# Patient Record
Sex: Female | Born: 1945 | Race: White | Hispanic: No | Smoking: Never smoker
Health system: Southern US, Community
[De-identification: ages and names within clinical notes are randomized; demographics above are authoritative.]

## PROBLEM LIST (undated history)

## (undated) DIAGNOSIS — E039 Hypothyroidism, unspecified: Secondary | ICD-10-CM

## (undated) DIAGNOSIS — F909 Attention-deficit hyperactivity disorder, unspecified type: Secondary | ICD-10-CM

## (undated) DIAGNOSIS — E785 Hyperlipidemia, unspecified: Secondary | ICD-10-CM

## (undated) DIAGNOSIS — F419 Anxiety disorder, unspecified: Secondary | ICD-10-CM

## (undated) DIAGNOSIS — N8501 Benign endometrial hyperplasia: Secondary | ICD-10-CM

## (undated) DIAGNOSIS — F329 Major depressive disorder, single episode, unspecified: Secondary | ICD-10-CM

## (undated) DIAGNOSIS — F32A Depression, unspecified: Secondary | ICD-10-CM

## (undated) DIAGNOSIS — M1990 Unspecified osteoarthritis, unspecified site: Secondary | ICD-10-CM

## (undated) DIAGNOSIS — I10 Essential (primary) hypertension: Secondary | ICD-10-CM

## (undated) DIAGNOSIS — N289 Disorder of kidney and ureter, unspecified: Secondary | ICD-10-CM

## (undated) DIAGNOSIS — K5190 Ulcerative colitis, unspecified, without complications: Secondary | ICD-10-CM

## (undated) DIAGNOSIS — Z9289 Personal history of other medical treatment: Secondary | ICD-10-CM

## (undated) DIAGNOSIS — I639 Cerebral infarction, unspecified: Secondary | ICD-10-CM

## (undated) DIAGNOSIS — E079 Disorder of thyroid, unspecified: Secondary | ICD-10-CM

## (undated) HISTORY — PX: COLON SURGERY: SHX602

## (undated) HISTORY — PX: WISDOM TOOTH EXTRACTION: SHX21

## (undated) HISTORY — PX: EYE SURGERY: SHX253

## (undated) HISTORY — PX: COLONOSCOPY: SHX174

## (undated) HISTORY — PX: OTHER SURGICAL HISTORY: SHX169

---

## 1997-05-31 ENCOUNTER — Ambulatory Visit (HOSPITAL_COMMUNITY): Admission: RE | Admit: 1997-05-31 | Discharge: 1997-05-31 | Payer: Self-pay | Admitting: General Surgery

## 2000-01-02 ENCOUNTER — Other Ambulatory Visit: Admission: RE | Admit: 2000-01-02 | Discharge: 2000-01-02 | Payer: Self-pay | Admitting: Obstetrics and Gynecology

## 2000-02-13 ENCOUNTER — Encounter: Payer: Self-pay | Admitting: Critical Care Medicine

## 2000-02-13 ENCOUNTER — Ambulatory Visit (HOSPITAL_COMMUNITY): Admission: RE | Admit: 2000-02-13 | Discharge: 2000-02-13 | Payer: Self-pay | Admitting: Critical Care Medicine

## 2000-05-07 ENCOUNTER — Encounter: Payer: Self-pay | Admitting: Critical Care Medicine

## 2000-05-07 ENCOUNTER — Ambulatory Visit (HOSPITAL_COMMUNITY): Admission: RE | Admit: 2000-05-07 | Discharge: 2000-05-07 | Payer: Self-pay | Admitting: Critical Care Medicine

## 2001-01-27 ENCOUNTER — Other Ambulatory Visit: Admission: RE | Admit: 2001-01-27 | Discharge: 2001-01-27 | Payer: Self-pay | Admitting: Obstetrics and Gynecology

## 2002-01-07 ENCOUNTER — Other Ambulatory Visit: Admission: RE | Admit: 2002-01-07 | Discharge: 2002-01-07 | Payer: Self-pay | Admitting: Gynecology

## 2003-02-06 DIAGNOSIS — Z9289 Personal history of other medical treatment: Secondary | ICD-10-CM

## 2003-02-06 HISTORY — DX: Personal history of other medical treatment: Z92.89

## 2003-06-01 ENCOUNTER — Ambulatory Visit (HOSPITAL_BASED_OUTPATIENT_CLINIC_OR_DEPARTMENT_OTHER): Admission: RE | Admit: 2003-06-01 | Discharge: 2003-06-01 | Payer: Self-pay | Admitting: Orthopedic Surgery

## 2003-06-01 ENCOUNTER — Encounter (INDEPENDENT_AMBULATORY_CARE_PROVIDER_SITE_OTHER): Payer: Self-pay | Admitting: *Deleted

## 2003-06-01 ENCOUNTER — Ambulatory Visit (HOSPITAL_COMMUNITY): Admission: RE | Admit: 2003-06-01 | Discharge: 2003-06-01 | Payer: Self-pay | Admitting: Orthopedic Surgery

## 2003-09-14 ENCOUNTER — Inpatient Hospital Stay (HOSPITAL_COMMUNITY): Admission: EM | Admit: 2003-09-14 | Discharge: 2003-09-23 | Payer: Self-pay | Admitting: Emergency Medicine

## 2003-09-15 ENCOUNTER — Encounter (INDEPENDENT_AMBULATORY_CARE_PROVIDER_SITE_OTHER): Payer: Self-pay | Admitting: Specialist

## 2003-12-02 ENCOUNTER — Other Ambulatory Visit: Admission: RE | Admit: 2003-12-02 | Discharge: 2003-12-02 | Payer: Self-pay | Admitting: Gynecology

## 2004-10-13 ENCOUNTER — Encounter: Admission: RE | Admit: 2004-10-13 | Discharge: 2004-10-13 | Payer: Self-pay | Admitting: Nephrology

## 2004-12-07 ENCOUNTER — Other Ambulatory Visit: Admission: RE | Admit: 2004-12-07 | Discharge: 2004-12-07 | Payer: Self-pay | Admitting: Gynecology

## 2005-02-18 ENCOUNTER — Encounter: Admission: RE | Admit: 2005-02-18 | Discharge: 2005-02-18 | Payer: Self-pay | Admitting: Orthopaedic Surgery

## 2005-05-11 ENCOUNTER — Emergency Department (HOSPITAL_COMMUNITY): Admission: EM | Admit: 2005-05-11 | Discharge: 2005-05-12 | Payer: Self-pay | Admitting: Emergency Medicine

## 2006-01-02 ENCOUNTER — Other Ambulatory Visit: Admission: RE | Admit: 2006-01-02 | Discharge: 2006-01-02 | Payer: Self-pay | Admitting: Gynecology

## 2007-01-23 ENCOUNTER — Other Ambulatory Visit: Admission: RE | Admit: 2007-01-23 | Discharge: 2007-01-23 | Payer: Self-pay | Admitting: Gynecology

## 2007-06-05 ENCOUNTER — Emergency Department (HOSPITAL_COMMUNITY): Admission: EM | Admit: 2007-06-05 | Discharge: 2007-06-05 | Payer: Self-pay | Admitting: Emergency Medicine

## 2010-03-03 ENCOUNTER — Other Ambulatory Visit: Payer: Self-pay | Admitting: Gynecology

## 2010-06-23 NOTE — Consult Note (Signed)
NAME:  Cassidy Jones, Cassidy Jones                      ACCOUNT NO.:  [id number]   MEDICAL RECORD NO.:  [id number]                   PATIENT TYPE:  INP   LOCATION:  1824                                 FACILITY:  [facility]   PHYSICIAN:  James L. Deterding, M.D.            DATE OF BIRTH:  May 20, 1945   DATE OF CONSULTATION:  DATE OF DISCHARGE:                                   CONSULTATION   CONSULTING PHYSICIAN:  James L. Deterding, M.D.   REFERRING PHYSICIAN:  Mark A. Perini, M.D.   REASON FOR CONSULTATION:  Acute renal failure.   HISTORY OF PRESENT ILLNESS:  This is a 65 year old woman with an extensive  past medical history who was brought to the ER after having been found to  have a creatinine of 5.9 with a calcium of 15.2.  Her past medical history  is significant for rheumatic fever as a child, history of a thyroidectomy  for goiters and at age 26, and ulcerative colitis diagnosed at age 37.  She  was on steroids for about 20 years, had an ileoanal pull through with  ileostomy and subsequent rectal pouch in 1987.  She has a history of chronic  pouchitis, treated with antibiotics, history of fibrocystic breast, history  of ADD, osteoporosis, and a history of an RV fistula.  She was started on 6-  MP in May for UC complications, also about a month or so prior to that she  increased her calcium with D to 4-6 a day.  She has been taking that a  number of months and she says at least that many.  She complains at this  time of aches in her hands, shoulders, hips, feet, increased thirst.  She  has no dysuria, hematuria, no stones, no family history of renal disease, no  history of hypertension.  She has had a progressive anemia also during this  time.   REVIEW OF SYSTEMS:  HEENT:  She denies visual trouble, headaches, sores in  her eyes, dry mouth.  No hearing difficulties.  PULMONARY:  No asthma, hay-  fever, cough, sputum production. GI:  As above.  No history of hepatitis,  yellow jaundice.   SKIN:  Itching has been pretty intense recently.  MUSCULOSKELETAL:  As listed above.  NEUROLOGIC:  Negative.   PAST MEDICAL HISTORY:  1. As above.  2. Also she has had the thyroidectomy.  3. She had also the ileostomy takedown.   ALLERGIES:  ___________ allergies.   SOCIAL HISTORY:  She lives with her husband.  Nondrinker, nonsmoker.  She  used to be an Print production planner for [employer].   OBJECTIVE/PHYSICAL EXAMINATION:  VITAL SIGNS:  Blood pressure supine is  128/68, heart rate 82, blood pressure 146/72, heart rate 86 standing.  GENERAL:  She is in no acute distress.  HEENT:  Shows benign fundi and pharynx.  NECK:  Shows the thyroidectomy scar.  No thyromegaly.  LUNGS:  Reveal no rales, rhonchi,  wheeze.  Normal to percussion, normal  expansion, normal breath sounds.  ABDOMEN:  Positive bowel sounds.  Mild lower abdominal distention with mild  diffuse tenderness.  Liver is down 2- to -3-cm.  SKIN:  Shows her to be somewhat pale.  No significant rashes.  MUSCULOSKELETAL:  She has hypertrophic changes in her hands and feet,  otherwise unremarkable.  NEUROLOGIC:  Cranial nerves II-XII grossly intact.  Motor is 5/5 symmetric.  Deep tendon reflexes 2+/4+.  Toes are downgoing.   LABORATORY DATA:  In November 2004, creatinine was 0.9, BUN was 16, and no  electrolyte abnormalities.  Creatinine, on Jul 04, 2003, was 1.8 with a BUN  of 21, and calcium was 11.6 at that time.  Hemoglobin, on August 12, 2003, was  10.5.  Laboratory data from, September 14, 2003, reveal sodium 136, potassium  4.1, chloride 95, bicarbonate 26, creatinine 5.9, BUN 68, glucose 113.  Iron  sat at 16%.   ASSESSMENT:  1. Acute renal failure.  Differential diagnoses include hypercalcemic toxic     acute renal failure, acute interstitial nephritis or acute tubular     necrosis of medications, i.e., the 6-MP, or obstruction.  Suspect she has     hypercalcemic etiology second and this contributes to the fact that  she     has low urine volume because of high volume loss in her stool and     concentrates her calcium because of that.  Cannot rule out acute     interstitial nephritis from drugs, i.e., the 6-MP but we will try to sort     that out by looking at her urine sediment.  Treatment at this point is     saline and Lasix to try to get her to excrete the calcium and watch her     renal function closely.  We also need to evaluate for obstruction with an     ultrasound make sure she does not have nephrocalcinosis.  2. Ulcerative colitis.  3. Hypothyroidism.  4. Attention deficit disorder.  5. Hypercalcemic suspect this is exogenous to intake but rule out primary     hyperparathyroidism or dysproteinemia.  6. Anemia, renal and low serum iron.   PLAN:  1. IV fluids with normal saline and follow with Lasix.  2. Urinalysis.  3. Urine sodium creatinine.  4. IV iron.  5. PTH.  6. Serum urine electrophoresis .  7. Ultrasound.                                               James L. Deterding, M.D.    JLD/MEDQ  D:  09/14/2003  T:  09/14/2003  Job:  [id number]

## 2010-06-23 NOTE — Discharge Summary (Signed)
NAME:  Cassidy Jones, Cassidy Jones                      ACCOUNT NO.:  [id number]   MEDICAL RECORD NO.:  [id number]                   PATIENT TYPE:  INP   LOCATION:  2033                                 FACILITY:  [facility]   PHYSICIAN:  Mark A. Perini, M.D.                DATE OF BIRTH:  30-May-1945   DATE OF ADMISSION:  09/14/2003  DATE OF DISCHARGE:  09/23/2003                                 DISCHARGE SUMMARY   DISCHARGE DIAGNOSES:  1. Toxicity from 6-mercaptopurine.  2. Acute renal failure.  3. Severe hypercalcemia with subsequent hypocalcemia after treatment for     hypercalcemia.  4. Long standing ulcerative colitis.  5. Hypothyroidism.  6. Anxiety.  7. Post menopausal hormone status.  8. Nausea, vomiting and anorexia resolving at the time of discharge.  9. Peripheral neuropathy.  10.      Chronic pouchitis.  11.      History of ileoanal pull-through procedure.  12.      Osteopenia.   PROCEDURES:  Nephrology consultation.   DISCHARGE MEDICATIONS:  1. Synthroid 112 mcg daily.  2. Discontinue 6-mercaptopurine forever.  3. Kay Dur 20 mEq daily.  4. Furosemide 40 mg as needed, use minimally.  5. Tincture of opium and paregoric as needed as before.  6. ProctoFoam.  7. Hydrocortisone cream as needed.  8. Vivelle 0.05 mg per day patch as before.  9. Vitamin D daily.  10.      Multivitamin daily.  11.      Calcium D one tablet three times daily with food, to be adjusted     further as an outpatient.  12.      Prometrium as before.  13.      Neurontin 100 mg up to t.i.d. p.r.n.  14.      Cipro 500 mg daily.  15.      Ativan 0.5 mg one half to one tablet up to three times daily as     needed for anxiety.   HISTORY OF PRESENT ILLNESS:  Cassidy Jones is a 65 year old female with past  history of significant for ulcerative colitis, hypothyroidism and anxiety.  She has had significant problems with chronic pouchitis and has been  maintained on chronic Cipro therapy.  She has been followed  at the [facility].  In April, she was placed on 6-mercaptopurine, and has been taking  50 mg daily since that time.  She has had some serial blood draws.  On July  8, she had a set of liver function tests that were normal and a CBC that  showed mild anemia of 10.5.  Her last BUN and creatinine were done in May  2005.  At that time, her BUN was 21 and creatinine 1.5 and calcium was  slightly elevated at 11.6.  In November 2004, she had normal renal function  with a BUN of 16, creatinine 0.9 and calcium 9.5.  She presented to  our  office with progressive unsteady gait and tremor and weakness.  Things were  slipping through her hands.  She has had on and off arthralgias.  She denied  any chest pain or shortness of breath.  As part of her evaluation,  laboratory work showed hypercalcemia with a level of 15.2 mg/dL and a  markedly elevated BUN and creatinine of 68 and 5.9, respectively.  She is  admitted for further treatment.   HOSPITAL COURSE:  Cassidy Jones was felt to have acute renal failure due to  the 6-mercaptopurine, and it was found that she was taking 6 Caltrate with D  tablets daily, and we felt that this was contributing to her hypercalcemia.  She was treated with aggressive IV hydration.  She was treated with 1-2  doses of intravenous Lasix.  She was treated with subcutaneous calcitonin  for 36 hours every 6 hours.  Her peak calcium level was 18.  With these  measures, her calcium levels began to drop, and her BUN and creatinine  steadily improved.  The 6-mercaptopurine was discontinued.  Renal ultrasound  did show some mild hydronephrosis on the right.  Repeat ultrasound showed  improvement of this.  She was not felt to have a significant obstructive  uropathy.  She gradually improved.  She did have significant anxiety which  required addition of Ativan.  She had significant hypokalemia which was  replaced.  She also had significant anemia and was given one dose of Aranesp   and a transfusion of two units of packed red cells with appropriate increase  in her hemoglobin.  She did develop hypocalcemia and required reinstitution  of calcium and vitamin D therapy, and this did start to improve at the time  of discharge.  For the first several days of her hospital course, she had  significant nausea and vomiting and poor appetite, and by the end of her  hospital stay, these had improved significantly.  By September 23, 2003, she  was deemed stable for discharge home with her husband.   PHYSICAL EXAMINATION:  VITAL SIGNS:  Afebrile, temperature 98.7, pulse 78,  respirations 18, blood pressure 122/68, 97% saturation on room air, weight  125.7 pounds.  GENERAL APPEARANCE:  She was in no acute distress.  HEART:  Regular rate and rhythm with no murmur.  ABDOMEN:  Soft and nontender.  LUNGS:  Clear to auscultation bilaterally with no wheezes, rales or rhonchi.  NEUROLOGICAL:  Intact.  EXTREMITIES:  No significant peripheral edema.  Only trace pedal edema.   DISCHARGE LABORATORIES:  White count 6.6 with 65% segs, 20% lymphocytes, 10%  monocytes.  Hemoglobin 10.9, platelets 331,000.  Sodium 141, potassium 3.6,  chloride 114, CO2 22, BUN 15, creatinine 2.0, glucose 87, total bilirubin  0.4, alk-phos 55, AST 29, ALT 31, albumin 2.7, protein 5.3, calcium 6.2.  Ionized calcium from August 17 which was slightly low at 0.93, but was  trending up.   Peak ionized calcium level was 2.48 millimoles per liter on September 15, 2003,  and the nadir was 0.87 millimoles per liter on September 21, 2003.  Peak serum  calcium was 18.1 on September 14, 2003, with a low serum calcium noted to be 5.9  on September 21, 2003, but this was in association with an albumin of 2.5.  LDH  was low at 88 on September 14, 2003.  Magnesium was 3.3, phosphorous 2.9.  Liver  functions essentially remained normal except low protein levels.  SPEP was performed which showed total  protein 6.9, low serum albumin 52%, alpha 1-   globulin level was a little bit high at 7.2%.  Alpha 2-globulin level was  13%, slightly elevated.  Beta globulin, beta 2-globulin and gamma globulin  were all normal.  There was no significant monoclonal bands noted.  There  was a nonspecific pattern.  CK was 91, CK MB was 2.0 on September 14, 2003.  TSH  was 0.336 on September 14, 2003.  Iron level was 59, TIBC 270, percent  saturation 22.  Intact PTH was markedly suppressed at 2.6 picograms per ml.  A 25 hydroxy vitamin D level was elevated at 73 nanograms per ml, but 125  dihydroxy vitamin D level was low at 13 picograms per ml.  Urine creatinine  was 10.0 mg/dL and urine sodium was 96 mEq per liter on September 15, 2003.  Renal ultrasound from September 17, 2003, showed improved right hydronephrosis  and minimal fullness of the right collecting system.   DISCHARGE INSTRUCTIONS:  Mrs. Monsivais is to be up as tolerated.  She is to  get some sunlight daily to help improve her active vitamin D levels.  She is  to call if there are any recurrent problems.  She is to call our office for  a follow up visit in two weeks and is to come to our lab next Tuesday for a  C-MET and CBC with differential.                                                Mark A. Waynard Edwards, M.D.    MAP/MEDQ  D:  09/23/2003  T:  09/24/2003  Job:  [id number]   cc:   Christell Constant, M.D.  [facility]

## 2010-06-23 NOTE — Op Note (Signed)
NAME:  Cassidy Jones, Cassidy Jones                      ACCOUNT NO.:  [id number]   MEDICAL RECORD NO.:  [id number]                   PATIENT TYPE:  AMB   LOCATION:  DSC                                  FACILITY:  [facility]   PHYSICIAN:  Cindee Salt, M.D.                    DATE OF BIRTH:  1945-06-29   DATE OF PROCEDURE:  06/01/2003  DATE OF DISCHARGE:                                 OPERATIVE REPORT   PREOPERATIVE DIAGNOSIS:  Mucoid cyst, right thumb.   POSTOPERATIVE DIAGNOSIS:  Mucoid cyst, right thumb.   OPERATION:  Excision, mucoid cyst; debridement of interphalangeal joint,  right thumb.   SURGEON:  Cindee Salt, M.D.   ASSISTANTCarolyne Fiscal.   ANESTHESIA:  Forearm-based IV regional.   HISTORY:  The patient is a 65 year old female with a history of a mucoid  cyst on the dorsal aspect IP joint of her right thumb.  She is desirous of  removal.   PROCEDURE:  The patient was brought to the operating room, where a forearm-  based IV regional anesthetic was carried out without difficulty.  She was  prepped using Duraprep in supine position, right arm free.  A curvilinear  incision was made over the mass, carried down through subcutaneous tissue.  Bleeders were electrocauterized, the neurovascular structures otherwise  protected.  The deflated cyst, which was thickened, was immediately  apparent.  With blunt and sharp dissection this was dissected free and sent  to pathology.  The joint was opened.  An exostosis was present.  This was  then debrided with a small rongeur.  The wound was copiously irrigated with  saline.  The skin was then closed with interrupted 5-0 nylon sutures.  Sterile compressive dressing and splint to the thumb was applied.  The  patient tolerated the procedure well and was taken to the recovery room for  observation in satisfactory condition.  She is discharged home to return to  the [facility] in one week, on Vicodin.           Cindee Salt, M.D.    Angelique Blonder  D:  06/01/2003  T:  06/01/2003  Job:  [id number]

## 2010-06-23 NOTE — H&P (Signed)
NAME:  Cassidy Jones, Cassidy Jones                      ACCOUNT NO.:  [id number]   MEDICAL RECORD NO.:  [id number]                   PATIENT TYPE:  INP   LOCATION:                                       FACILITY:  [facility]   PHYSICIAN:  Mark A. Perini, M.D.                DATE OF BIRTH:  April 18, 1945   DATE OF ADMISSION:  09/14/2003  DATE OF DISCHARGE:                                HISTORY & PHYSICAL   CHIEF COMPLAINT:  Weakness and unsteady walking and tremulousness.   HISTORY OF PRESENT ILLNESS:  Cassidy Jones is a pleasant 65 year old female  with past medical history significant for longstanding ulcerative colitis,  hypothyroidism, and anxiety.  She has had ulcerative colitis since at least  age 57.  In 1987, she had an ileoanal pull through procedure.  She has been  plagued with GI problems due to her ulcerative colitis.  She has had chronic  pouchitis and is maintained on chronic Cipro therapy.  She has been seen at  the [facility] and followed by Dr. Christell Constant there.  Apparently, in April  she was placed 6-mercaptopurine and she has been taking this a the 50 mg  daily since that time.  She has had several serial blood draws.  Most  recently on July 8 which showed normal liver tests and mild anemia with  hemoglobin of 10.5.  Her last BUN and creatinine were done on May 25.  At  that time her BUN was 21 and creatinine was 1.5 and her calcium was 11.6.  Previously, in November 2004 she had normal renal function with BUN of 16  and creatinine of 0.9 and calcium of 9.5.  She presented to the office today  with one month history of progressiveness of unsteady gait and tremor.  She  has had weakness.  At times she feels as though things are slipping through  her hands.  She has had on and off joint pain symptoms for the last three to  four months.  She takes Lasix 40 mg about twice a week for swelling and this  has not changed recently.  She denies any fevers.  She has had no new chest  pain or  shortness of breath.  She denies any blood from above or below.  As  part of her evaluation today she had blood work sent which shows a  significant hypercalcemia with level of 15.2 mg/dl as well as markedly  elevated BUN and creatinine with BUN of 68 and a creatinine of 5.9.  She  will require admission for further treatment and evaluation.   PAST MEDICAL HISTORY:  1. Rheumatic fever at age 81.  2. Hyperthyroidism as a teenager status post thyroidectomy at age 53.  3. History of goiters removed at age 78.  60. Ulcerative colitis diagnosed at age 45.  5. Chronic prednisone treatment from the age of 73 to 49.  53. In 5,  ileoanal pull through procedure.  7. Attention deficit disorder diagnosed officially at age 22, but she has     been plagued with this her whole life.  8. History of uterine polyp removal.  9. Fibrocystic breast.  10.      G0 parity status.  11.      Pneumonia x1 in the winter in 2001.  12.      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  13.      Osteopenia since 2002.  14.      Depression and obsessive compulsive disorder trait and anxiety.  15.      Bilateral toe paresthesias.   ALLERGIES:  1. FLAGYL caused neuropathy and numbness in her toes.  2. IRON.  She has difficulty tolerating iron due to loss of appetite, nausea     and increase in diarrhea.   MEDICATIONS:  1. Synthroid 112 mcg daily.  2. Purinethol.  3. 6 Mercaptopurine 50 mg daily.  4. K-Dur 20 mEq daily.  5. Furosemide 40 mg twice weekly as needed.  6. Tincture of opium 3-9 drips t.i.d. for diarrhea.  7. Paregoric 2 mg in 5 mL 3-5 teaspoons daily as needed for diarrhea.  8. ProctoFoam HC 1% cream as needed.  9. Vivelle Dot 0.05 mg per day patch.  10.      Ferizole as needed.  11.      Vitamin B complex daily.  12.      Multivitamin daily.  13.      Calcium with D 600 mg daily.  14.      Zinc has been discontinued.  15.      Prometrium 200 mg first 12 days of each month.  16.       Neurontin 100-200 mg as needed.  17.      Cipro 500 mg daily.   SOCIAL HISTORY:  She is married.  Her husband Peyton Najjar is very supportive.  She  has no children.  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  No tobacco history.  No alcohol use  history.  No drug use history.  Father living with history of congestive  heart failure and arrhythmia.  Mother is alive with mild asthma and  degenerative disk disease of her lumbar spine.  She has a sister with  ulcerative colitis and a brother who is otherwise healthy.  No children.   REVIEW OF SYSTEMS:  As per the history of present illness.  She does have  multiple frequent stools on a daily basis.  Her weight has been essentially  unchanged.   PHYSICAL EXAMINATION:  VITAL SIGNS:  Weight 119, blood pressure 132/70,  pulse 84.  She is in no acute distress.  There is no tremor noted today.  HEENT:  Mucosa is pink and moist.  There is no jugular venous distention.  Thyroid is normal.  There are no bruits.  LUNGS:  Clear to auscultation bilaterally.  HEART:  Regular  rate and rhythm with no murmurs, rubs or gallops.  There is  no peripheral edema.  ABDOMEN:  Soft and nontender with no masses.  Normal active bowel sounds are  palpated.  NEUROLOGIC:  There are 2-3+ distal deep tendon reflexes throughout, slightly  hyperreflexic.  Cranial nerves II-XII are intact.  Strength is grossly  normal.  Affect is normal.   LABORATORY DATA:  TSH is 0.307, ferritin 142, B12 level 1200, glucose 113,  BUN 68, creatinine 5.9, sodium 136,  potassium 4.1, chloride 95, CO2 26,  calcium 15.2, total protein 6.9, albumin 4.2, AST 21, ALT 22, alk phos 61,  total bili 0.2, iron 53, total iron binding capacity is 325, percent  saturation is somewhat low at 16.3, white count 6.2 with normal  differential, hemoglobin 9.8 with an MCV of 94.3, platelet count 297,000. Other laboratory work is pending including magnesium and  phosphorus level,  LDH, ionized calcium, CK enzymes, intact PTH, chest x-ray and EKG are all  pending at this time.   ASSESSMENT/PLAN:  Acute to subacute renal failure with significant  hypercalcemia.  I think this is all due to toxicity from 6 Mercaptopurine.  We will admit her to a transitional care level bed.  We will hydrate  aggressively  with normal saline.  We will ask for a nephrology consultation.  She will  need a renal ultrasound to rule out obstructive uropathy.  We may treat with  Calcitonin as well to help lower calcium levels.  Prognosis is guarded.  The  patient is a full code status.  We will place her on DVT and ulcer  prophylaxis.                                                Mark A. Waynard Edwards, M.D.    MAP/MEDQ  D:  09/14/2003  T:  09/14/2003  Job:  [id number]

## 2010-07-04 ENCOUNTER — Other Ambulatory Visit: Payer: Self-pay | Admitting: Dermatology

## 2010-08-17 ENCOUNTER — Other Ambulatory Visit: Payer: Self-pay | Admitting: Dermatology

## 2011-04-30 ENCOUNTER — Other Ambulatory Visit: Payer: Self-pay | Admitting: Gynecology

## 2013-09-17 ENCOUNTER — Other Ambulatory Visit: Payer: Self-pay | Admitting: Dermatology

## 2013-11-12 ENCOUNTER — Emergency Department (HOSPITAL_COMMUNITY): Payer: 59

## 2013-11-12 ENCOUNTER — Encounter (HOSPITAL_COMMUNITY): Payer: Self-pay | Admitting: Emergency Medicine

## 2013-11-12 ENCOUNTER — Emergency Department (HOSPITAL_COMMUNITY)
Admission: EM | Admit: 2013-11-12 | Discharge: 2013-11-12 | Disposition: A | Discharge status: Home / Self Care | Payer: 59 | Attending: Emergency Medicine | Admitting: Emergency Medicine

## 2013-11-12 DIAGNOSIS — F419 Anxiety disorder, unspecified: Secondary | ICD-10-CM | POA: Diagnosis not present

## 2013-11-12 DIAGNOSIS — Y9289 Other specified places as the place of occurrence of the external cause: Secondary | ICD-10-CM | POA: Diagnosis not present

## 2013-11-12 DIAGNOSIS — F329 Major depressive disorder, single episode, unspecified: Secondary | ICD-10-CM | POA: Diagnosis not present

## 2013-11-12 DIAGNOSIS — S61411A Laceration without foreign body of right hand, initial encounter: Secondary | ICD-10-CM | POA: Insufficient documentation

## 2013-11-12 DIAGNOSIS — S0083XA Contusion of other part of head, initial encounter: Secondary | ICD-10-CM

## 2013-11-12 DIAGNOSIS — S0093XA Contusion of unspecified part of head, initial encounter: Secondary | ICD-10-CM | POA: Insufficient documentation

## 2013-11-12 DIAGNOSIS — E079 Disorder of thyroid, unspecified: Secondary | ICD-10-CM | POA: Diagnosis not present

## 2013-11-12 DIAGNOSIS — S161XXA Strain of muscle, fascia and tendon at neck level, initial encounter: Secondary | ICD-10-CM | POA: Insufficient documentation

## 2013-11-12 DIAGNOSIS — S0990XA Unspecified injury of head, initial encounter: Secondary | ICD-10-CM | POA: Diagnosis present

## 2013-11-12 DIAGNOSIS — W01198A Fall on same level from slipping, tripping and stumbling with subsequent striking against other object, initial encounter: Secondary | ICD-10-CM | POA: Insufficient documentation

## 2013-11-12 DIAGNOSIS — Y9301 Activity, walking, marching and hiking: Secondary | ICD-10-CM | POA: Diagnosis not present

## 2013-11-12 DIAGNOSIS — Z87448 Personal history of other diseases of urinary system: Secondary | ICD-10-CM | POA: Diagnosis not present

## 2013-11-12 DIAGNOSIS — Z79899 Other long term (current) drug therapy: Secondary | ICD-10-CM | POA: Insufficient documentation

## 2013-11-12 DIAGNOSIS — S80211A Abrasion, right knee, initial encounter: Secondary | ICD-10-CM | POA: Diagnosis not present

## 2013-11-12 DIAGNOSIS — S0091XA Abrasion of unspecified part of head, initial encounter: Secondary | ICD-10-CM | POA: Insufficient documentation

## 2013-11-12 DIAGNOSIS — S0081XA Abrasion of other part of head, initial encounter: Secondary | ICD-10-CM

## 2013-11-12 HISTORY — DX: Depression, unspecified: F32.A

## 2013-11-12 HISTORY — DX: Anxiety disorder, unspecified: F41.9

## 2013-11-12 HISTORY — DX: Major depressive disorder, single episode, unspecified: F32.9

## 2013-11-12 HISTORY — DX: Disorder of thyroid, unspecified: E07.9

## 2013-11-12 HISTORY — DX: Disorder of kidney and ureter, unspecified: N28.9

## 2013-11-12 MED ORDER — CYCLOBENZAPRINE HCL 10 MG PO TABS: 10.0000 mg | ORAL_TABLET | Freq: Two times a day (BID) | ORAL | Status: DC | PRN

## 2013-11-12 NOTE — Discharge Instructions (Signed)
Abrasion °An abrasion is a cut or scrape of the skin. Abrasions do not extend through all layers of the skin and most heal within 10 days. It is important to care for your abrasion properly to prevent infection. °CAUSES  °Most abrasions are caused by falling on, or gliding across, the ground or other surface. When your skin rubs on something, the outer and inner layer of skin rubs off, causing an abrasion. °DIAGNOSIS  °Your caregiver will be able to diagnose an abrasion during a physical exam.  °TREATMENT  °Your treatment depends on how large and deep the abrasion is. Generally, your abrasion will be cleaned with water and a mild soap to remove any dirt or debris. An antibiotic ointment may be put over the abrasion to prevent an infection. A bandage (dressing) may be wrapped around the abrasion to keep it from getting dirty.  °You may need a tetanus shot if: °· You cannot remember when you had your last tetanus shot. °· You have never had a tetanus shot. °· The injury broke your skin. °If you get a tetanus shot, your arm may swell, get red, and feel warm to the touch. This is common and not a problem. If you need a tetanus shot and you choose not to have one, there is a rare chance of getting tetanus. Sickness from tetanus can be serious.  °HOME CARE INSTRUCTIONS  °· If a dressing was applied, change it at least once a day or as directed by your caregiver. If the bandage sticks, soak it off with warm water.   °· Wash the area with water and a mild soap to remove all the ointment 2 times a day. Rinse off the soap and pat the area dry with a clean towel.   °· Reapply any ointment as directed by your caregiver. This will help prevent infection and keep the bandage from sticking. Use gauze over the wound and under the dressing to help keep the bandage from sticking.   °· Change your dressing right away if it becomes wet or dirty.   °· Only take over-the-counter or prescription medicines for pain, discomfort, or fever as  directed by your caregiver.   °· Follow up with your caregiver within 24-48 hours for a wound check, or as directed. If you were not given a wound-check appointment, look closely at your abrasion for redness, swelling, or pus. These are signs of infection. °SEEK IMMEDIATE MEDICAL CARE IF:  °· You have increasing pain in the wound.   °· You have redness, swelling, or tenderness around the wound.   °· You have pus coming from the wound.   °· You have a fever or persistent symptoms for more than 2-3 days. °· You have a fever and your symptoms suddenly get worse. °· You have a bad smell coming from the wound or dressing.   °MAKE SURE YOU:  °· Understand these instructions. °· Will watch your condition. °· Will get help right away if you are not doing well or get worse. °Document Released: 11/01/2004 Document Revised: 01/09/2012 Document Reviewed: 12/26/2010 °ExitCare® Patient Information ©2015 [employer]. This information is not intended to replace advice given to you by your health care provider. Make sure you discuss any questions you have with your health care provider. ° °Head Injury °You have received a head injury. It does not appear serious at this time. Headaches and vomiting are common following head injury. It should be easy to awaken from sleeping. Sometimes it is necessary for you to stay in the emergency department for   a while for observation. Sometimes admission to the hospital may be needed. After injuries such as yours, most problems occur within the first 24 hours, but side effects may occur up to 7-10 days after the injury. It is important for you to carefully monitor your condition and contact your health care provider or seek immediate medical care if there is a change in your condition. °WHAT ARE THE TYPES OF HEAD INJURIES? °Head injuries can be as minor as a bump. Some head injuries can be more severe. More severe head injuries include: °· A jarring injury to the brain (concussion). °· A bruise  of the brain (contusion). This mean there is bleeding in the brain that can cause swelling. °· A cracked skull (skull fracture). °· Bleeding in the brain that collects, clots, and forms a bump (hematoma). °WHAT CAUSES A HEAD INJURY? °A serious head injury is most likely to happen to someone who is in a car wreck and is not wearing a seat belt. Other causes of major head injuries include bicycle or motorcycle accidents, sports injuries, and falls. °HOW ARE HEAD INJURIES DIAGNOSED? °A complete history of the event leading to the injury and your current symptoms will be helpful in diagnosing head injuries. Many times, pictures of the brain, such as CT or MRI are needed to see the extent of the injury. Often, an overnight hospital stay is necessary for observation.  °WHEN SHOULD I SEEK IMMEDIATE MEDICAL CARE?  °You should get help right away if: °· You have confusion or drowsiness. °· You feel sick to your stomach (nauseous) or have continued, forceful vomiting. °· You have dizziness or unsteadiness that is getting worse. °· You have severe, continued headaches not relieved by medicine. Only take over-the-counter or prescription medicines for pain, fever, or discomfort as directed by your health care provider. °· You do not have normal function of the arms or legs or are unable to walk. °· You notice changes in the black spots in the center of the colored part of your eye (pupil). °· You have a clear or bloody fluid coming from your nose or ears. °· You have a loss of vision. °During the next 24 hours after the injury, you must stay with someone who can watch you for the warning signs. This person should contact local emergency services (911 in the [place] if you have seizures, you become unconscious, or you are unable to wake up. °HOW CAN I PREVENT A HEAD INJURY IN THE FUTURE? °The most important factor for preventing major head injuries is avoiding motor vehicle accidents.  To minimize the potential for damage to your  head, it is crucial to wear seat belts while riding in motor vehicles. Wearing helmets while bike riding and playing collision sports (like football) is also helpful. Also, avoiding dangerous activities around the house will further help reduce your risk of head injury.  °WHEN CAN I RETURN TO NORMAL ACTIVITIES AND ATHLETICS? °You should be reevaluated by your health care provider before returning to these activities. If you have any of the following symptoms, you should not return to activities or contact sports until 1 week after the symptoms have stopped: °· Persistent headache. °· Dizziness or vertigo. °· Poor attention and concentration. °· Confusion. °· Memory problems. °· Nausea or vomiting. °· Fatigue or tire easily. °· Irritability. °· Intolerant of bright lights or loud noises. °· Anxiety or depression. °· Disturbed sleep. °MAKE SURE YOU:  °· Understand these instructions. °· Will watch your condition. °· Will get   help right away if you are not doing well or get worse. °Document Released: 01/22/2005 Document Revised: 01/27/2013 Document Reviewed: 09/29/2012 °ExitCare® Patient Information ©2015 [employer]. This information is not intended to replace advice given to you by your health care provider. Make sure you discuss any questions you have with your health care provider. ° °

## 2013-11-12 NOTE — ED Notes (Signed)
Pt reports stepping up onto curb when she tripped and fell face first onto sidewalk. Pt has abrasions noted to forehead, nose, fingers of R hand, L palm, and bilateral knees. Pt has been ambulatory since the fall.

## 2013-11-12 NOTE — ED Provider Notes (Signed)
CSN: [id number]     Arrival date & time 11/12/13  2009 History   First MD Initiated Contact with Patient 11/12/13 2013     Chief complaint: Fall HPI Pt was walking when she tripped over the curb getting to her car.  She fell hitting her head and face.  She did not lose consciousness.  She is having pain in her face and neck.  She is having pain in her right small finger as well as right knee.  She has been able to walk.  No vomiting.  No numbness or weakness. Past Medical History  Diagnosis Date  . Renal disorder   . Thyroid disease     thyroid gland removed  . Depression   . Anxiety    Past Surgical History  Procedure Laterality Date  . Cataract surgery    . Colon removed    . Eye surgery     No family history on file. History  Substance Use Topics  . Smoking status: Never Smoker   . Smokeless tobacco: Not on file  . Alcohol Use: 4.2 oz/week    7 Glasses of wine per week   OB History   Grav Para Term Preterm Abortions TAB SAB Ect Mult Living                 Review of Systems  All other systems reviewed and are negative.     Allergies  Metronidazole  Home Medications   Prior to Admission medications   Medication Sig Start Date End Date Taking? Authorizing Provider  calcitonin, salmon, (MIACALCIN/FORTICAL) 200 UNIT/ACT nasal spray 1 spray daily. 11/12/13  Yes Historical Provider, MD  calcitRIOL (ROCALTROL) 0.25 MCG capsule Take 0.25-0.5 mcg by mouth See admin instructions. Patient alternates between 0.47mcg and 0.37mcg daily 11/12/13  Yes Historical Provider, MD  calcium carbonate 200 MG capsule Take 300 mg by mouth daily.   Yes Historical Provider, MD  estradiol (CLIMARA - DOSED IN MG/24 HR) 0.025 mg/24hr patch 1 patch once a week. 11/12/13  Yes Historical Provider, MD  felodipine (PLENDIL) 5 MG 24 hr tablet Take 2.5 tablets by mouth daily.  10/02/13  Yes Historical Provider, MD  FLUoxetine (PROZAC) 20 MG capsule Take 1 capsule by mouth daily. 10/18/13  Yes Historical  Provider, MD  gabapentin (NEURONTIN) 300 MG capsule Take 1 capsule by mouth 3 (three) times daily. 08/25/13  Yes Historical Provider, MD  LORazepam (ATIVAN) 1 MG tablet Take 0.25-1 tablets by mouth See admin instructions. 1 tab mid-morning, 1/2 tab in the afternoon and 1/4 tab at bedtime as needed for anxiety 10/29/13  Yes Historical Provider, MD  metroNIDAZOLE (METROGEL) 0.75 % vaginal gel Place 1 Applicatorful vaginally See admin instructions. 5 times per week 11/12/13  Yes Historical Provider, MD  Multiple Vitamin (MULTIVITAMIN WITH MINERALS) TABS tablet Take 1 tablet by mouth daily.   Yes Historical Provider, MD  Opium Tincture, Paregoric, 2 MG/5ML TINC Take 5 mLs by mouth 3 (three) times daily. pain 11/01/13  Yes Historical Provider, MD  potassium chloride SA (K-DUR,KLOR-CON) 20 MEQ tablet Take 1 tablet by mouth every evening.  10/27/13  Yes Historical Provider, MD  pravastatin (PRAVACHOL) 20 MG tablet Take 1 tablet by mouth every evening.  10/15/13  Yes Historical Provider, MD  PREMARIN vaginal cream Place 1 Applicatorful vaginally 2 (two) times a week. 09/18/13  Yes Historical Provider, MD  PRESCRIPTION MEDICATION Place 1 suppository rectally See admin instructions. Metronidazole 125mg  Suppository: 1 suppository per rectum 5 times per week.  Yes Historical Provider, MD  SYNTHROID 75 MCG tablet Take 1 tablet by mouth daily. 09/27/13  Yes Historical Provider, MD  terconazole (TERAZOL 7) 0.4 % vaginal cream Place 1 applicator vaginally once a week.  11/12/13  Yes Historical Provider, MD  vitamin B-12 (CYANOCOBALAMIN) 1000 MCG tablet Take 1,000 mcg by mouth daily.   Yes Historical Provider, MD  VYVANSE 40 MG capsule Take 25 mg by mouth daily.  11/01/13  Yes Historical Provider, MD  zolpidem (AMBIEN) 5 MG tablet Take 1 tablet by mouth at bedtime as needed. sleep 10/27/13  Yes Historical Provider, MD  cyclobenzaprine (FLEXERIL) 10 MG tablet Take 1 tablet (10 mg total) by mouth 2 (two) times daily as needed for  muscle spasms. 11/12/13   Linwood DibblesJon Dvaughn Fickle, MD   There were no vitals taken for this visit. Physical Exam  Nursing note and vitals reviewed. Constitutional: She appears well-developed and well-nourished. No distress.  HENT:  Head: Normocephalic.  Right Ear: External ear normal.  Left Ear: External ear normal.  Abrasion midforehead with mild edema, and abrasion of the nares down through the subcutaneous tissue, no visible bone, no nasal displacement  Eyes: Conjunctivae are normal. Right eye exhibits no discharge. Left eye exhibits no discharge. No scleral icterus.  Neck: Neck supple. Spinous process tenderness present. No rigidity. No tracheal deviation and no erythema present.  Cardiovascular: Normal rate, regular rhythm and intact distal pulses.   Pulmonary/Chest: Effort normal and breath sounds normal. No stridor. No respiratory distress. She has no wheezes. She has no rales.  Abdominal: Soft. Bowel sounds are normal. She exhibits no distension. There is no tenderness. There is no rebound and no guarding.  Musculoskeletal: She exhibits no edema.       Right knee: She exhibits swelling (with abrasion). Tenderness found.       Thoracic back: She exhibits normal range of motion, no tenderness and no bony tenderness.       Lumbar back: She exhibits normal range of motion, no tenderness and no bony tenderness.       Right hand: She exhibits tenderness and laceration (abrasion over the small finger PIP joint). She exhibits normal range of motion.  Neurological: She is alert. She has normal strength. No cranial nerve deficit (no facial droop, extraocular movements intact, no slurred speech) or sensory deficit. She exhibits normal muscle tone. She displays no seizure activity. Coordination normal.  Skin: Skin is warm and dry. No rash noted.  Psychiatric: She has a normal mood and affect.    ED Course  Procedures (including critical care time)  Wounds irrigated.  Steri strip applied to nasal  wound Labs Review Labs Reviewed - No data to display  Imaging Review Ct Head Wo Contrast  11/12/2013   CLINICAL DATA:  Tripped and fell on curb. Fell onto the sidewalk eating effaced. Abrasions to the forehead and nose. No loss of consciousness.  EXAM: CT HEAD WITHOUT CONTRAST  CT CERVICAL SPINE WITHOUT CONTRAST  TECHNIQUE: Multidetector CT imaging of the head and cervical spine was performed following the standard protocol without intravenous contrast. Multiplanar CT image reconstructions of the cervical spine were also generated.  COMPARISON:  CT head without contrast 06/05/2007.  FINDINGS: CT HEAD FINDINGS  Remote lacunar infarcts of the left cerebellum are again noted. No acute infarct, hemorrhage, or mass lesion is present. The ventricles are of normal size. No significant extraaxial fluid collection is present. A right paramedian frontal scalp hematoma is present without an underlying fracture. The remaining paranasal sinuses  and the mastoid air cells are clear.  CT CERVICAL SPINE FINDINGS  The cervical spine is imaged from the skullbase through the T2-3 disc level. Progressive degenerative changes are noted at C5-6 and C6-7. Grade 1 degenerative anterolisthesis is evident at C4-5. There is fusion of posterior elements at C2-3 and C3-4, likely acquired. No acute fracture or traumatic subluxation is evident.  IMPRESSION: 1. Prominent right frontal scalp hematoma without an underlying fracture. 2. Remote lacunar infarct of the left cerebellum. 3. No acute intracranial abnormality. 4. 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 5. 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 6. No acute fracture or traumatic subluxation within the cervical spine.   Electronically Signed   By: Gennette Pac M.D.   On: 11/12/2013 21:44   Ct Cervical Spine Wo Contrast  11/12/2013   CLINICAL DATA:  Tripped and fell on curb. Fell onto the sidewalk eating effaced. Abrasions to the forehead and nose.  No loss of consciousness.  EXAM: CT HEAD WITHOUT CONTRAST  CT CERVICAL SPINE WITHOUT CONTRAST  TECHNIQUE: Multidetector CT imaging of the head and cervical spine was performed following the standard protocol without intravenous contrast. Multiplanar CT image reconstructions of the cervical spine were also generated.  COMPARISON:  CT head without contrast 06/05/2007.  FINDINGS: CT HEAD FINDINGS  Remote lacunar infarcts of the left cerebellum are again noted. No acute infarct, hemorrhage, or mass lesion is present. The ventricles are of normal size. No significant extraaxial fluid collection is present. A right paramedian frontal scalp hematoma is present without an underlying fracture. The remaining paranasal sinuses and the mastoid air cells are clear.  CT CERVICAL SPINE FINDINGS  The cervical spine is imaged from the skullbase through the T2-3 disc level. Progressive degenerative changes are noted at C5-6 and C6-7. Grade 1 degenerative anterolisthesis is evident at C4-5. There is fusion of posterior elements at C2-3 and C3-4, likely acquired. No acute fracture or traumatic subluxation is evident.  IMPRESSION: 1. Prominent right frontal scalp hematoma without an underlying fracture. 2. Remote lacunar infarct of the left cerebellum. 3. No acute intracranial abnormality. 4. 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 5. 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 6. No acute fracture or traumatic subluxation within the cervical spine.   Electronically Signed   By: Gennette Pac M.D.   On: 11/12/2013 21:44   Dg Knee Complete 4 Views Right  11/12/2013   CLINICAL DATA:  Initial evaluation for acute traumatic injury, fall  EXAM: RIGHT KNEE - COMPLETE 4+ VIEW  COMPARISON:  None.  FINDINGS: No acute fracture or dislocation. No joint effusion. There is focal prepatellar soft tissue swelling. No radiopaque foreign body. 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 Osseous mineralization normal.  IMPRESSION: 1. No acute fracture or or dislocation. 2. Moderate prepatellar soft tissue swelling.   Electronically Signed   By: Rise Mu M.D.   On: 11/12/2013 21:19   Dg Hand Complete Right  11/12/2013   CLINICAL DATA:  Tripped and fell on curb landing face first onto the site block. Abrasions to the fourth and fifth PIP joints. Pain.  EXAM: RIGHT HAND - COMPLETE 3+ VIEW  COMPARISON:  None.  FINDINGS: A soft tissue swelling is noted at the the PIP joints of the fourth and fifth digits. There soft tissue swelling over the dorsum of the MCP joints as well. No acute osseous abnormality is present. The wrist is located.  IMPRESSION:  1. Soft tissue swelling at the fourth and fifth PIP joints and over the dorsal aspect of the MCP joints without underlying fracture or dislocation.   Electronically Signed   By: Gennette Pac M.D.   On: 11/12/2013 21:19     MDM   Final diagnoses:  Facial contusion, initial encounter  Facial abrasion, initial encounter  Cervical strain, acute, initial encounter    No sign of serious injury.  Pt has a tissue defect within the nasal abrasion but does not appear amenable to suture.  Steri strip applied. Abx ointment to the wound.  At this time there does not appear to be any evidence of an acute emergency medical condition and the patient appears stable for discharge with appropriate outpatient follow up.     Linwood Dibbles, MD 11/12/13 2223

## 2014-12-10 IMAGING — CT CT HEAD W/O CM
4 series · 16 of 30 positions shown, 19 images · non-contrast
Comparison: CT head without contrast 06/05/2007.

CLINICAL DATA: Tripped and fell on curb. Fell onto the sidewalk
eating effaced. Abrasions to the forehead and nose. No loss of
consciousness.

EXAM:
CT HEAD WITHOUT CONTRAST
CT CERVICAL SPINE WITHOUT CONTRAST
TECHNIQUE: Multidetector CT imaging of the head and cervical spine was
performed following the standard protocol without intravenous
contrast. Multiplanar CT image reconstructions of the cervical spine
were also generated.

[Series 2: head w/o · axial · non-contrast · 0.43mm/px · z∈[-88,-38]mm · 2 of 31 slices shown]
[im 11/31  brain]
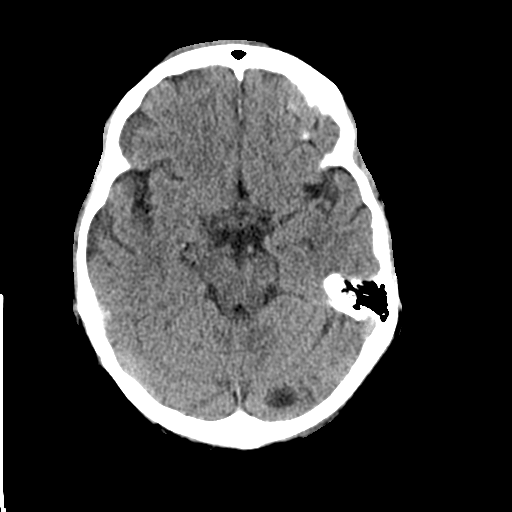
[im 21/31  brain]
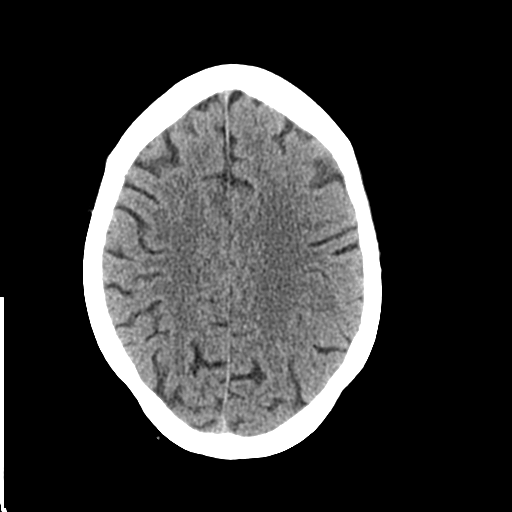

[Series 3: bone windows · axial · 0.43mm/px · z∈[-88,-38]mm · 2 of 31 slices shown]
[im 11/31  bone]
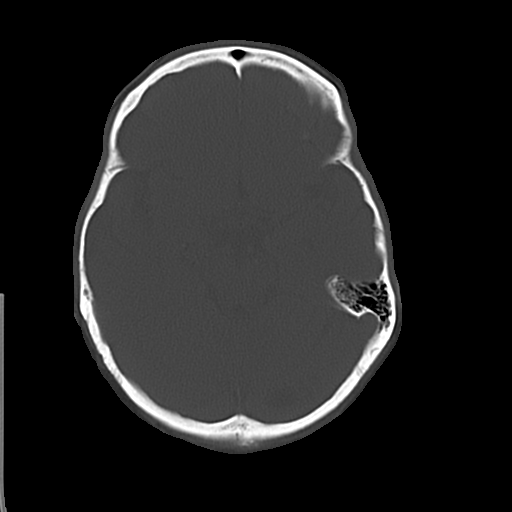
[im 21/31  bone]
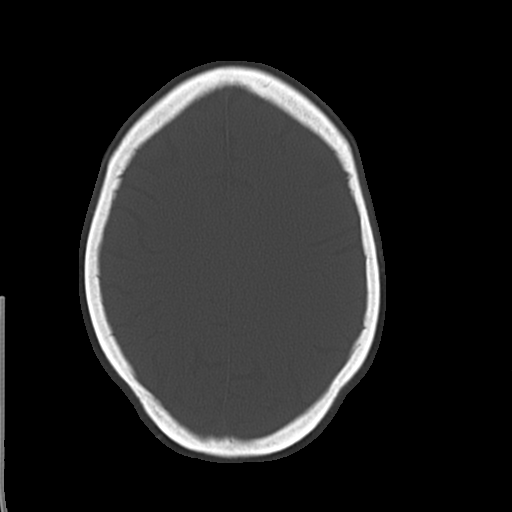

[Series 4: c-spine st · axial · 0.25mm/px · z∈[-257,-225]mm · 3 of 84 slices shown]
[im 9/84  brain]
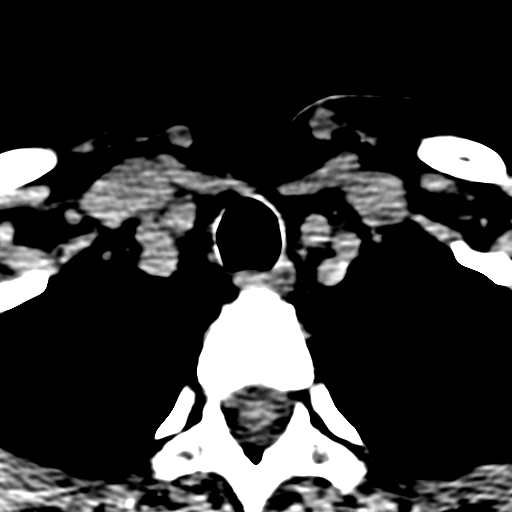
[im 17/84  brain]
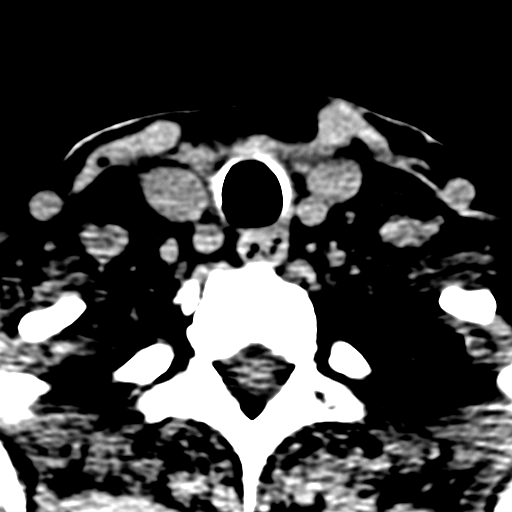
[im 25/84  brain]
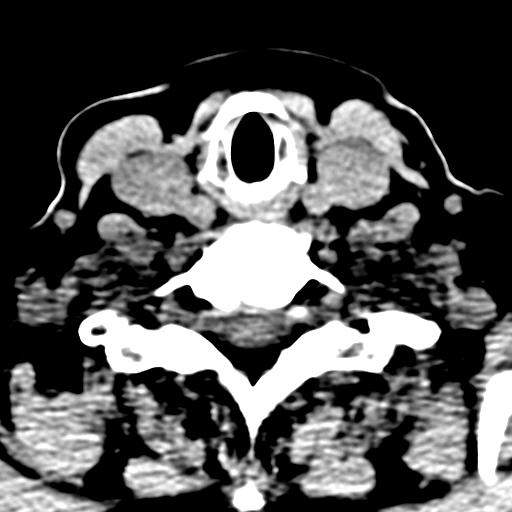

[Series 7: axial recon · axial · 0.23mm/px · z∈[-286,-164]mm · 9 of 89 slices shown, 12 images]
[im 9/89  brain]
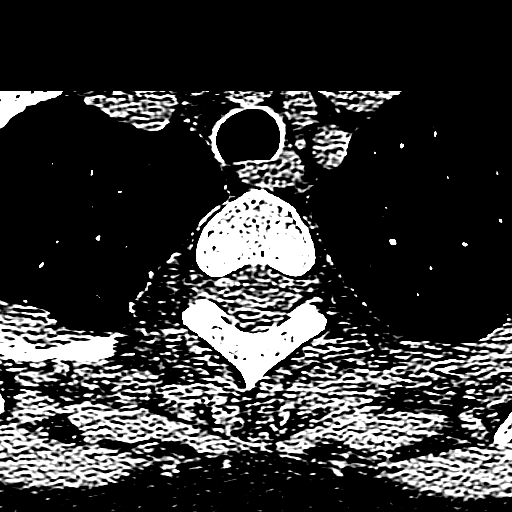
[im 9/89  bone]
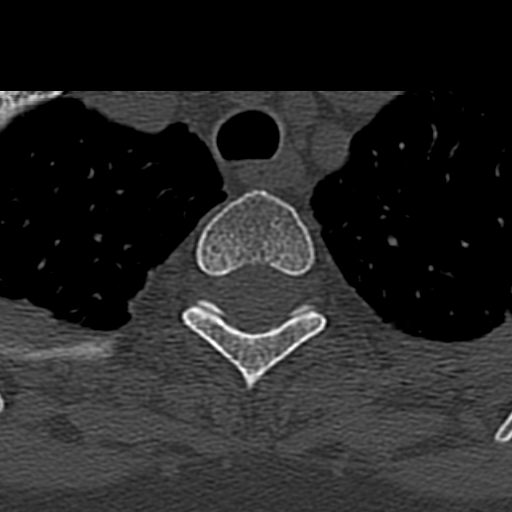
[im 18/89  brain]
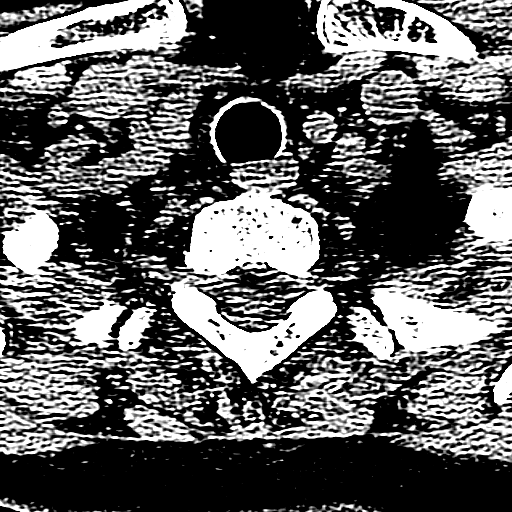
[im 27/89  brain]
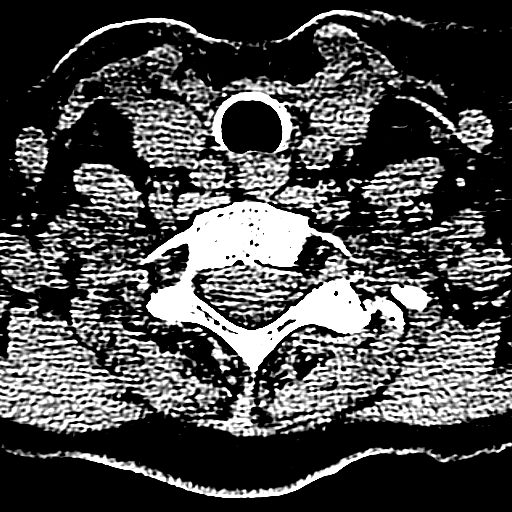
[im 36/89  brain]
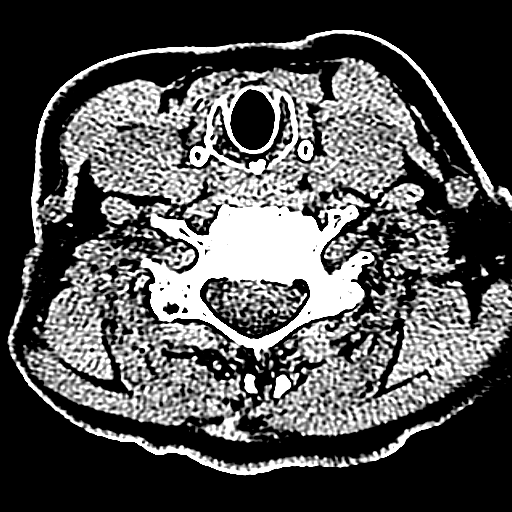
[im 45/89  brain]
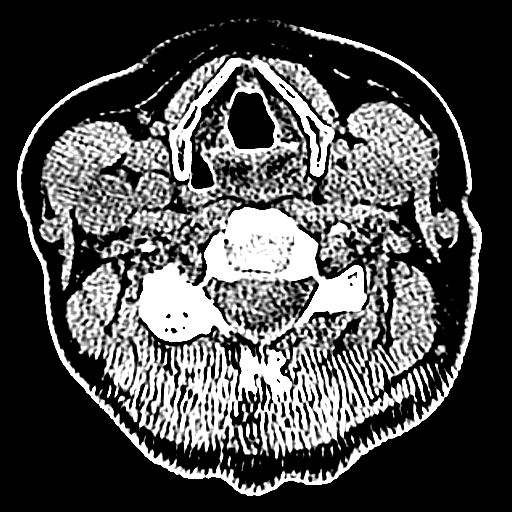
[im 45/89  bone]
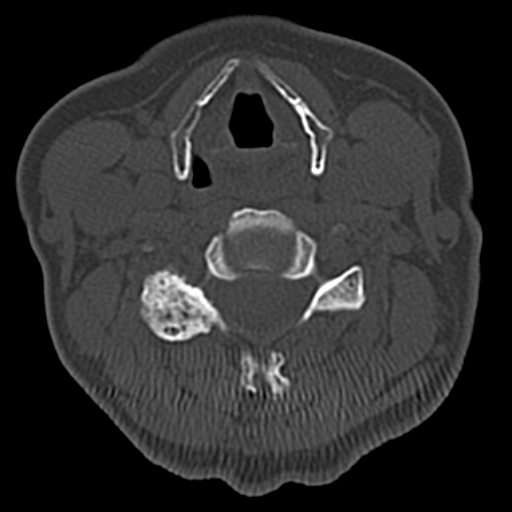
[im 53/89  brain]
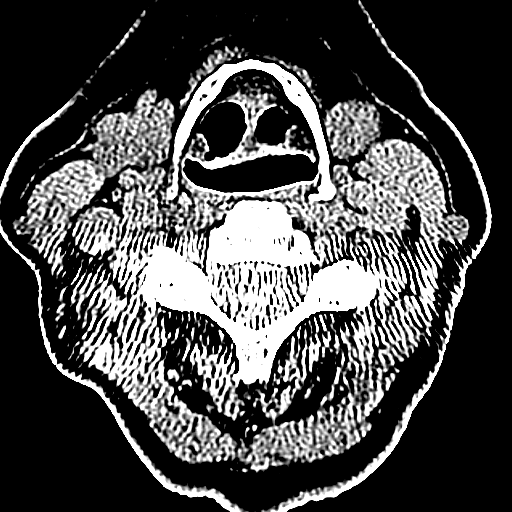
[im 62/89  brain]
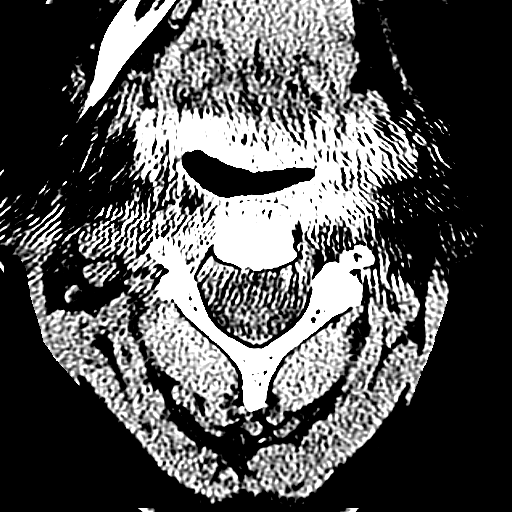
[im 71/89  brain]
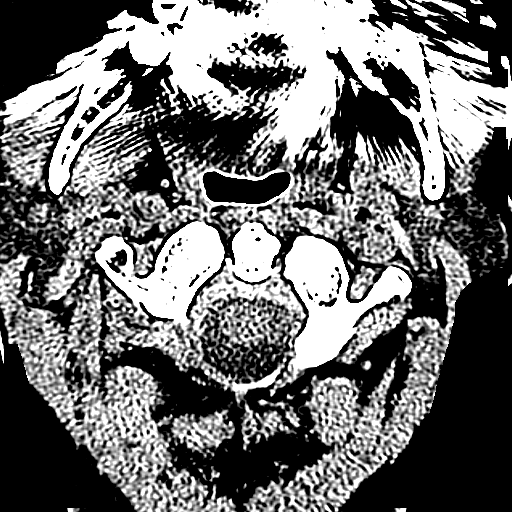
[im 80/89  brain]
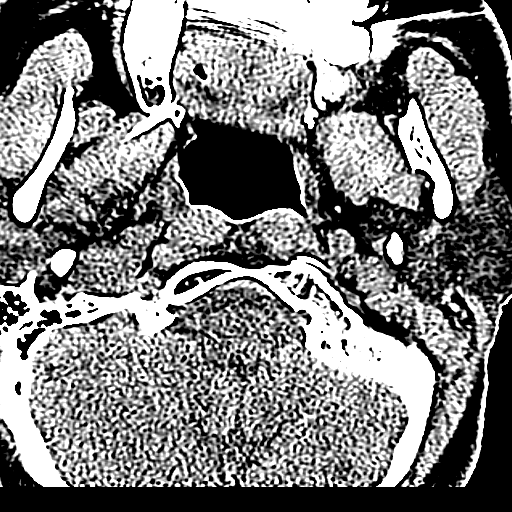
[im 80/89  bone]
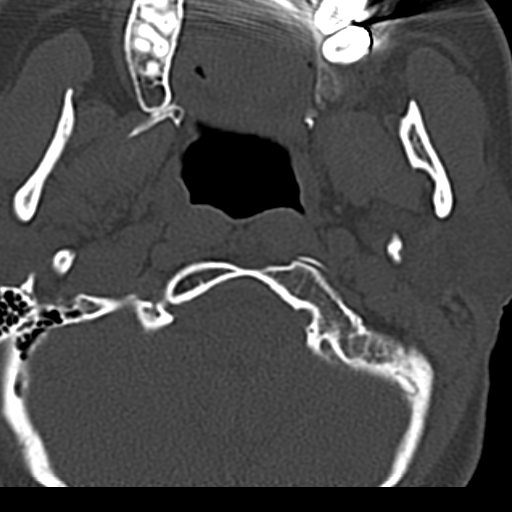

[16 of 30 positions shown; findings below may reference images not displayed]

FINDINGS: CT HEAD FINDINGS

Remote lacunar infarcts of the left cerebellum are again noted. No
acute infarct, hemorrhage, or mass lesion is present. The ventricles
are of normal size. No significant extraaxial fluid collection is
present. A right paramedian frontal scalp hematoma is present
without an underlying fracture. The remaining paranasal sinuses and
the mastoid air cells are clear.

CT CERVICAL SPINE FINDINGS

The cervical spine is imaged from the skullbase through the T2-3
disc level. Progressive degenerative changes are noted at C5-6 and
C6-7. Grade 1 degenerative anterolisthesis is evident at C4-5. There
is fusion of posterior elements at C2-3 and C3-4, likely acquired.
No acute fracture or traumatic subluxation is evident.
IMPRESSION: 1. Prominent right frontal scalp hematoma without an underlying
fracture.
2. Remote lacunar infarct of the left cerebellum.
3. No acute intracranial abnormality.
4. Progression of spondylosis in the lower cervical spine.
5. Fusion of posterior elements in the upper cervical spine.
6. No acute fracture or traumatic subluxation within the cervical
spine.

## 2014-12-10 IMAGING — CR DG HAND COMPLETE 3+V*R*
3 series · 3 of 3 positions shown · non-contrast
Comparison: None.

CLINICAL DATA: Tripped and fell on curb landing face first onto the
site block. Abrasions to the fourth and fifth PIP joints. Pain.

EXAM:
RIGHT HAND - COMPLETE 3+ VIEW

[x hand pa right]
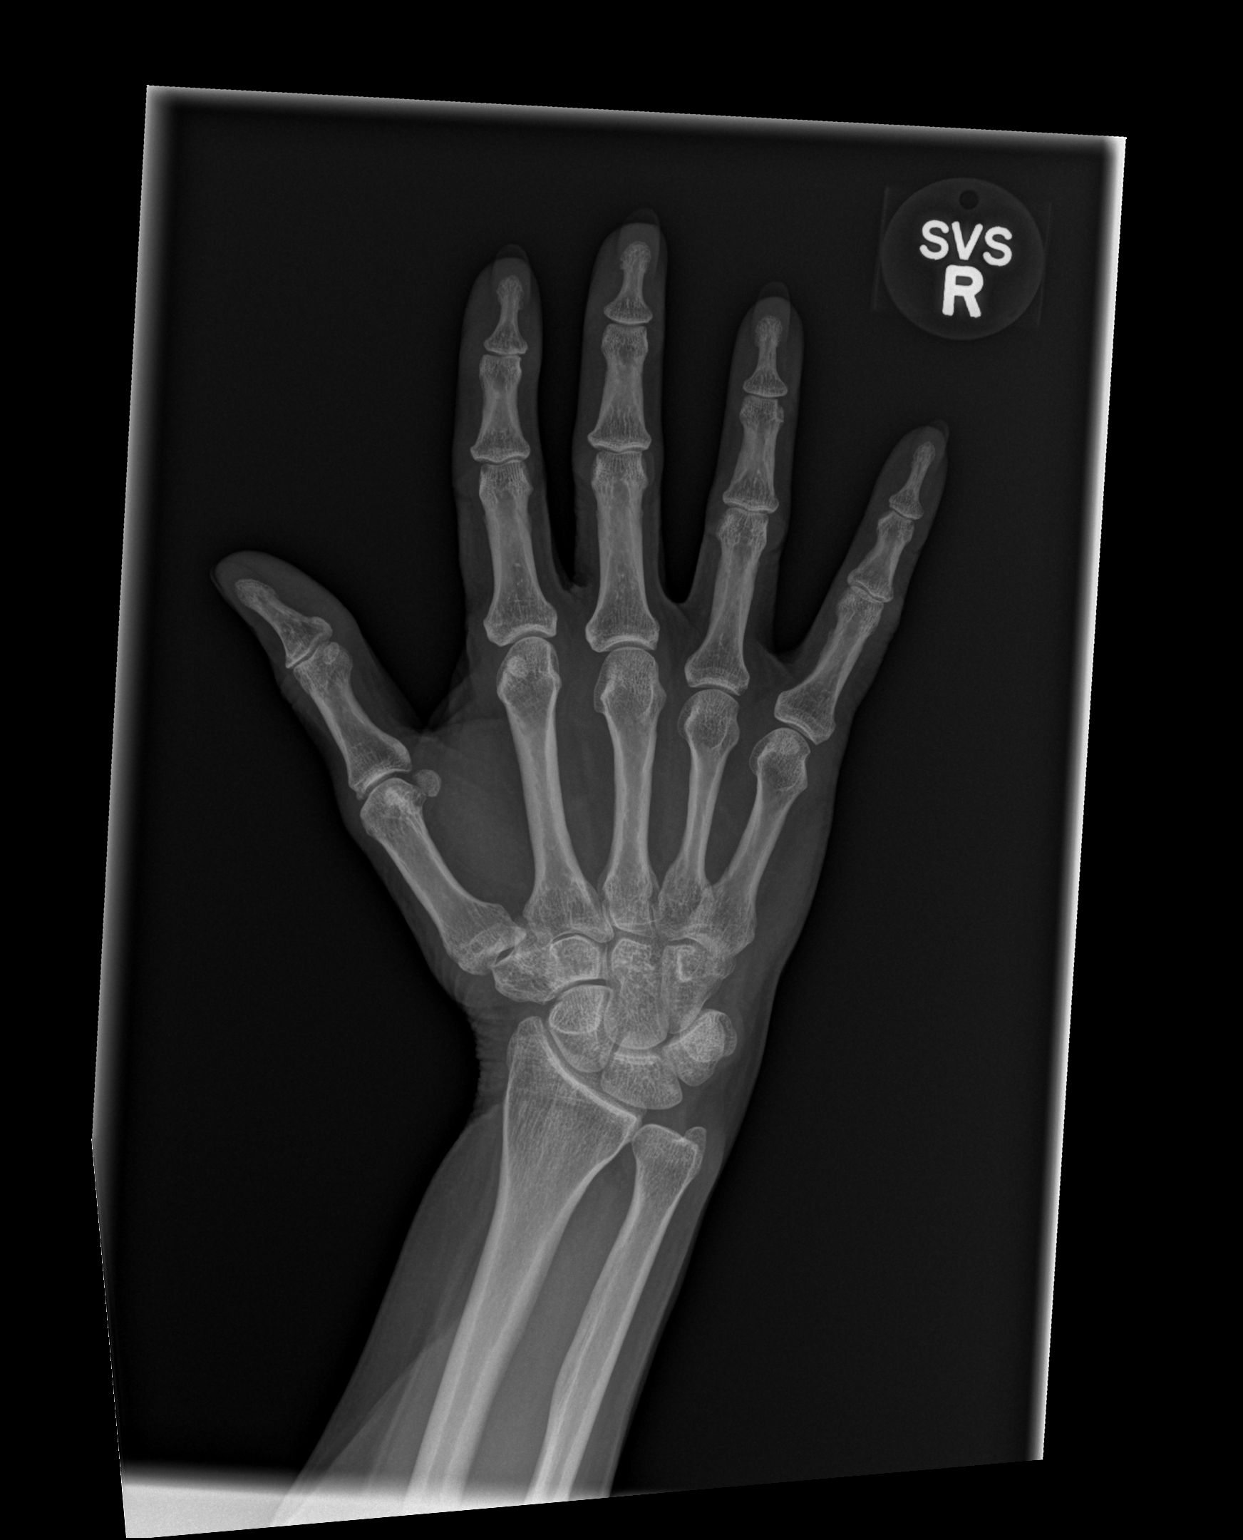

[x hand obl right]
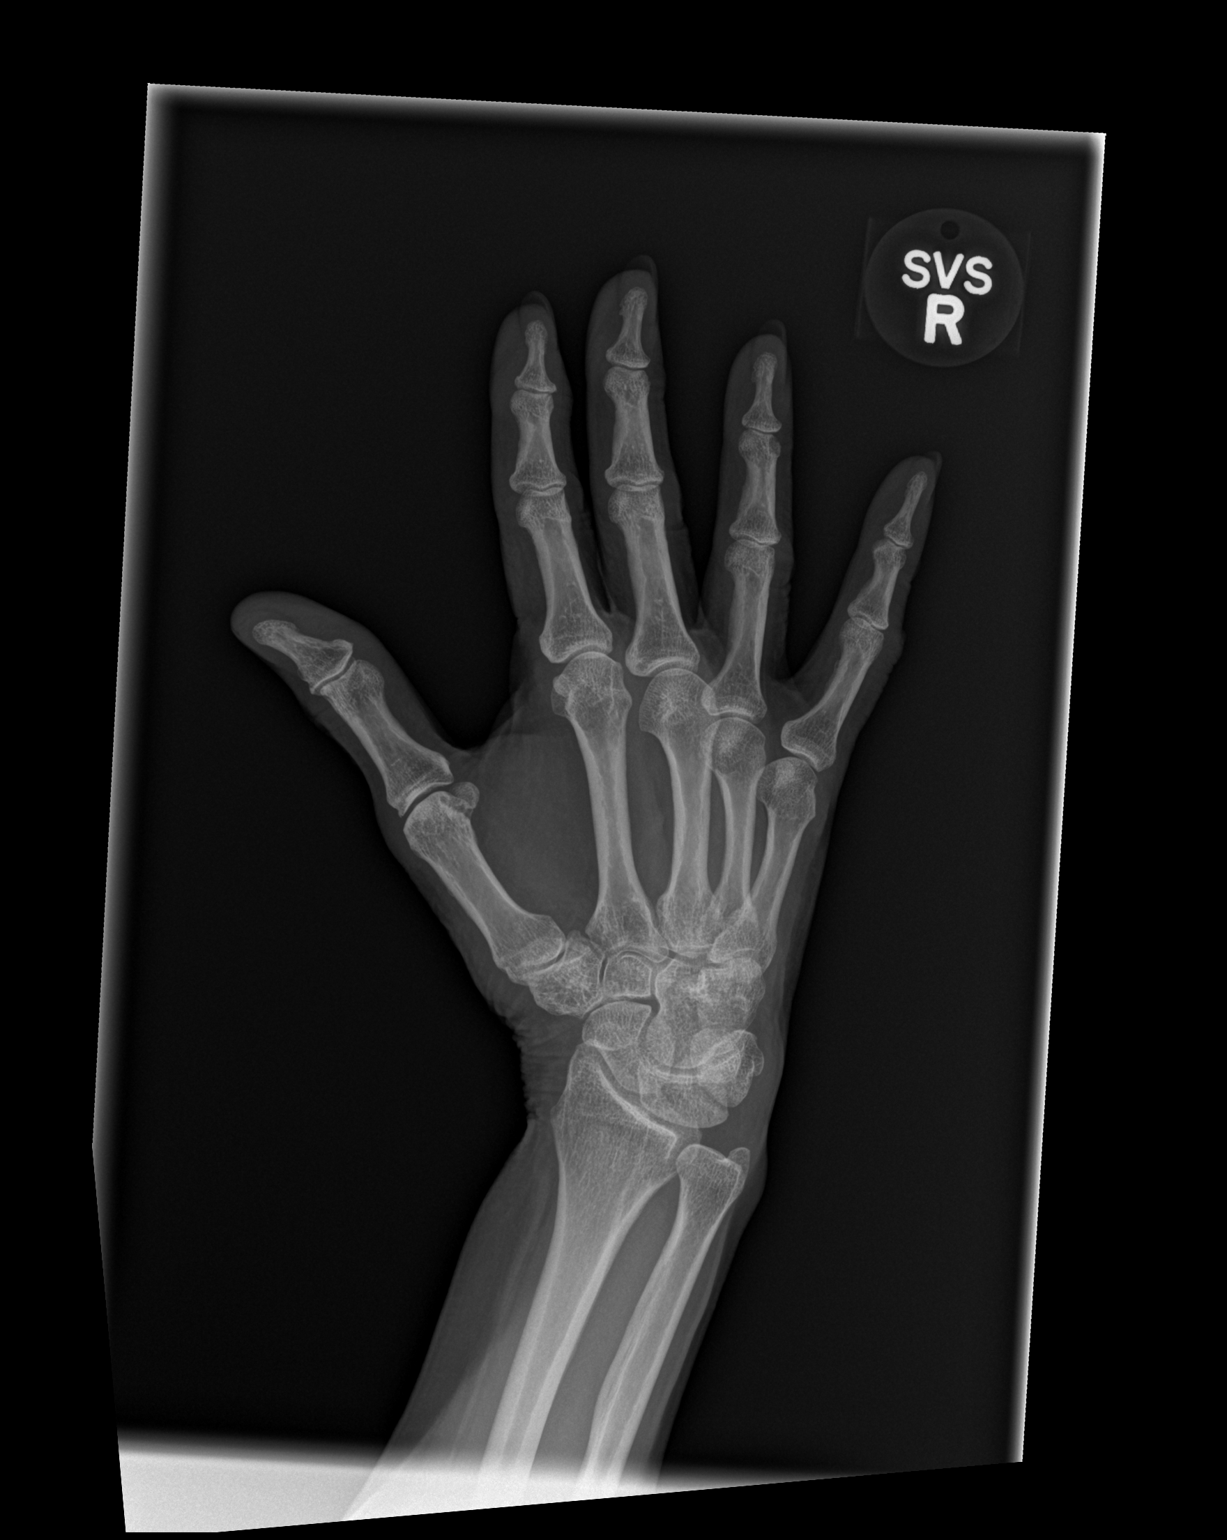

[x hand lat right]
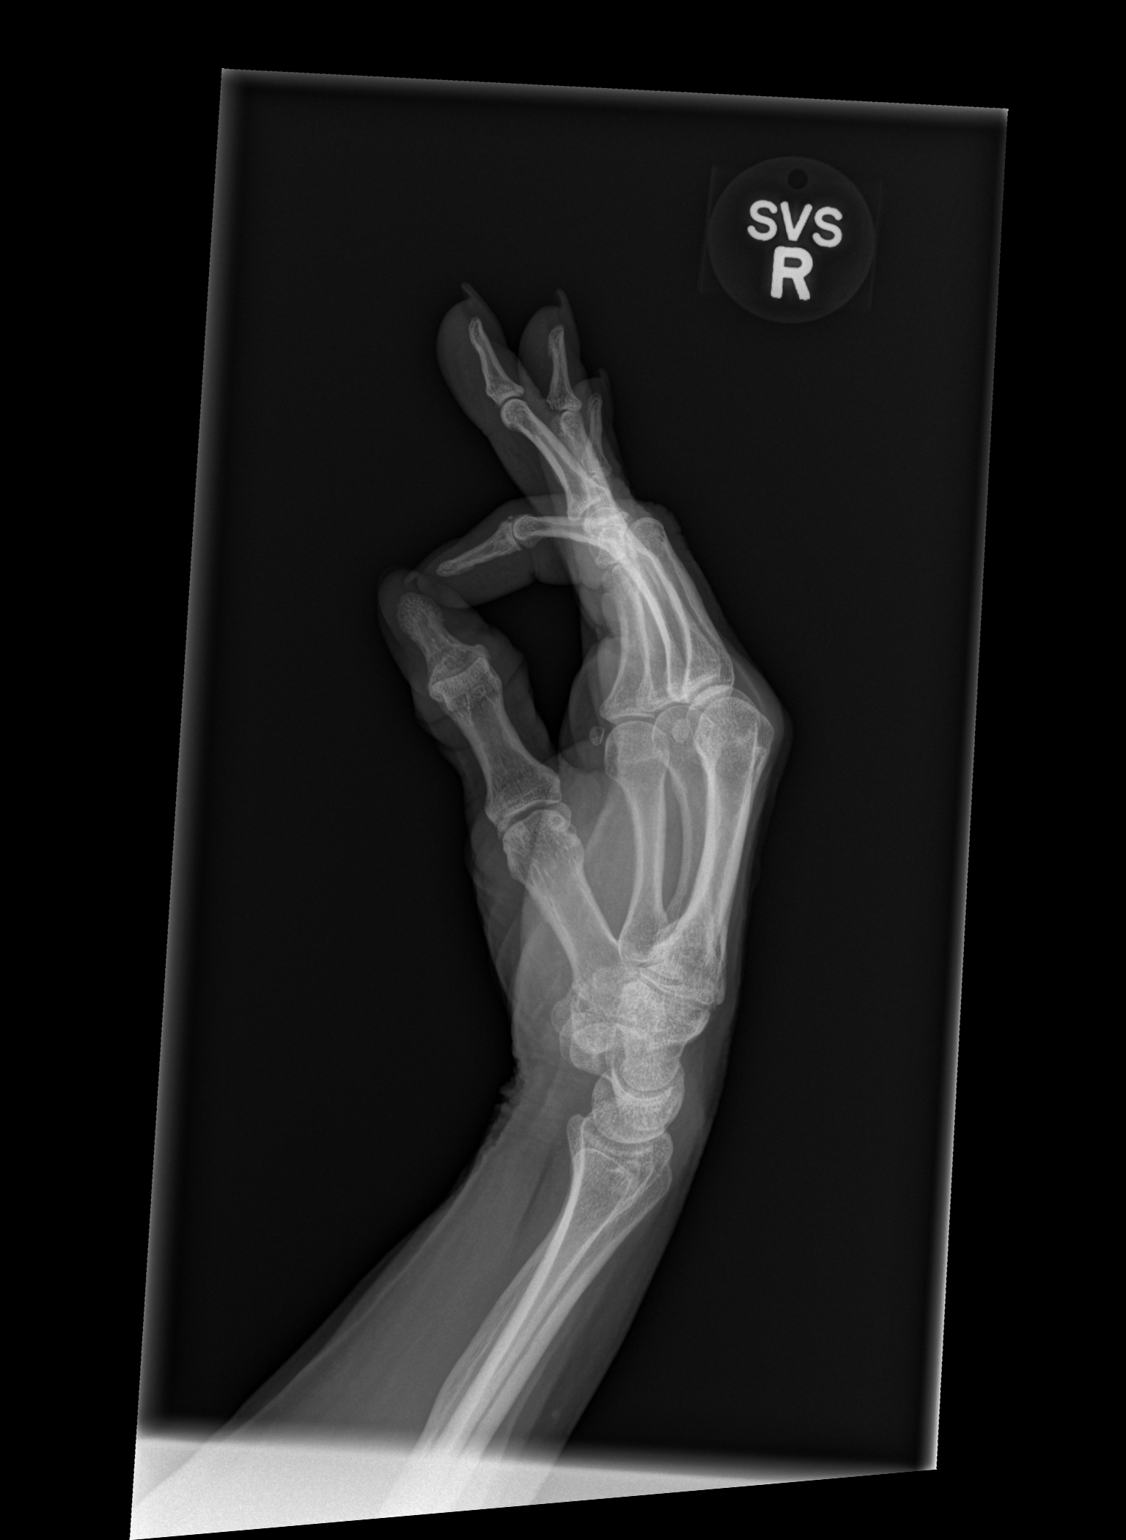

[3 of 3 positions shown; findings below may reference images not displayed]

FINDINGS: A soft tissue swelling is noted at the the PIP joints of the fourth
and fifth digits. There soft tissue swelling over the dorsum of the
MCP joints as well. No acute osseous abnormality is present. The
wrist is located.
IMPRESSION: 1. Soft tissue swelling at the fourth and fifth PIP joints and over
the dorsal aspect of the MCP joints without underlying fracture or
dislocation.

## 2015-04-08 ENCOUNTER — Other Ambulatory Visit: Payer: Self-pay | Admitting: Obstetrics

## 2015-05-02 ENCOUNTER — Encounter (HOSPITAL_COMMUNITY)
Admission: RE | Admit: 2015-05-02 | Discharge: 2015-05-02 | Disposition: A | Discharge status: Home / Self Care | Payer: Managed Care, Other (non HMO) | Source: Ambulatory Visit | Attending: Obstetrics | Admitting: Obstetrics

## 2015-05-02 ENCOUNTER — Encounter (HOSPITAL_COMMUNITY): Payer: Self-pay

## 2015-05-02 ENCOUNTER — Other Ambulatory Visit: Payer: Self-pay

## 2015-05-02 DIAGNOSIS — Z01812 Encounter for preprocedural laboratory examination: Secondary | ICD-10-CM | POA: Insufficient documentation

## 2015-05-02 DIAGNOSIS — Z01810 Encounter for preprocedural cardiovascular examination: Secondary | ICD-10-CM | POA: Insufficient documentation

## 2015-05-02 HISTORY — DX: Unspecified osteoarthritis, unspecified site: M19.90

## 2015-05-02 HISTORY — DX: Attention-deficit hyperactivity disorder, unspecified type: F90.9

## 2015-05-02 HISTORY — DX: Ulcerative colitis, unspecified, without complications: K51.90

## 2015-05-02 HISTORY — DX: Hypothyroidism, unspecified: E03.9

## 2015-05-02 HISTORY — DX: Hyperlipidemia, unspecified: E78.5

## 2015-05-02 HISTORY — DX: Cerebral infarction, unspecified: I63.9

## 2015-05-02 HISTORY — DX: Essential (primary) hypertension: I10

## 2015-05-02 HISTORY — DX: Personal history of other medical treatment: Z92.89

## 2015-05-02 LAB — BASIC METABOLIC PANEL
ANION GAP: 8 (ref 5–15)
BUN: 20 mg/dL (ref 6–20)
CO2: 25 mmol/L (ref 22–32)
Calcium: 9.5 mg/dL (ref 8.9–10.3)
Chloride: 107 mmol/L (ref 101–111)
Creatinine, Ser: 1.04 mg/dL — ABNORMAL HIGH (ref 0.44–1.00)
GFR calc Af Amer: >60 mL/min (ref >60)
GFR, EST NON AFRICAN AMERICAN: 52 mL/min — AB (ref >60)
GLUCOSE: 94 mg/dL (ref 65–99)
POTASSIUM: 4 mmol/L (ref 3.5–5.1)
Sodium: 140 mmol/L (ref 135–145)

## 2015-05-02 LAB — CBC
HEMATOCRIT: 40.7 % (ref 36.0–46.0)
HEMOGLOBIN: 13.5 g/dL (ref 12.0–15.0)
MCH: 31.4 pg (ref 26.0–34.0)
MCHC: 33.2 g/dL (ref 30.0–36.0)
MCV: 94.7 fL (ref 78.0–100.0)
Platelets: 246 10*3/uL (ref 150–400)
RBC: 4.3 MIL/uL (ref 3.87–5.11)
RDW: 13.2 % (ref 11.5–15.5)
WBC: 6 10*3/uL (ref 4.0–10.5)

## 2015-05-02 NOTE — Progress Notes (Signed)
SDS BB History Log given to lab for [facility].

## 2015-05-02 NOTE — Patient Instructions (Addendum)
Your procedure is scheduled on: Thursday, March 30  [facility]: [employer]  Pick up the phone at the desk and dial [phone].  Call this number if you have problems the morning of surgery: [phone].  Remember: Do NOT eat food: after midnight Wednesday Do NOT drink clear liquids after midnight Wednesday Take these medicines the morning of surgery with a SIP OF WATER:  Synthroid and paregoric   Do NOT wear jewelry (body piercing), metal hair clips/bobby pins, make-up, or nail polish. Do NOT wear lotions, powders, or perfumes.  You may wear deoderant. Do NOT shave for 48 hours prior to surgery. Do NOT bring valuables to the hospital.  Have a responsible adult drive you home and stay with you for 24 hours after your procedure.  Home with husband Cassidy NajjarLarry cell [phone].

## 2015-05-04 MED ORDER — DOXYCYCLINE HYCLATE 100 MG IV SOLR
100.0000 mg | Freq: Once | INTRAVENOUS | Status: DC
  Filled 2015-05-04: qty 100

## 2015-05-05 ENCOUNTER — Encounter (HOSPITAL_COMMUNITY): Admission: RE | Payer: Self-pay | Source: Ambulatory Visit

## 2015-05-05 ENCOUNTER — Ambulatory Visit (HOSPITAL_COMMUNITY)
Admission: RE | Admit: 2015-05-05 | Payer: Managed Care, Other (non HMO) | Source: Ambulatory Visit | Admitting: Obstetrics

## 2015-05-05 SURGERY — DILATATION AND CURETTAGE /HYSTEROSCOPY
Anesthesia: Choice

## 2015-05-06 ENCOUNTER — Ambulatory Visit (HOSPITAL_COMMUNITY): Payer: Managed Care, Other (non HMO) | Admitting: Anesthesiology

## 2015-05-06 ENCOUNTER — Ambulatory Visit (HOSPITAL_COMMUNITY)
Admission: RE | Admit: 2015-05-06 | Discharge: 2015-05-06 | Disposition: A | Discharge status: Home / Self Care | Payer: Managed Care, Other (non HMO) | Source: Ambulatory Visit | Attending: Obstetrics & Gynecology | Admitting: Obstetrics & Gynecology

## 2015-05-06 ENCOUNTER — Encounter (HOSPITAL_COMMUNITY)
Admission: RE | Disposition: A | Discharge status: Home / Self Care | Payer: Self-pay | Source: Ambulatory Visit | Attending: Obstetrics & Gynecology

## 2015-05-06 ENCOUNTER — Encounter (HOSPITAL_COMMUNITY): Payer: Self-pay | Admitting: Anesthesiology

## 2015-05-06 DIAGNOSIS — N8501 Benign endometrial hyperplasia: Secondary | ICD-10-CM | POA: Diagnosis not present

## 2015-05-06 DIAGNOSIS — E039 Hypothyroidism, unspecified: Secondary | ICD-10-CM | POA: Insufficient documentation

## 2015-05-06 DIAGNOSIS — Z79890 Hormone replacement therapy (postmenopausal): Secondary | ICD-10-CM | POA: Diagnosis not present

## 2015-05-06 DIAGNOSIS — N823 Fistula of vagina to large intestine: Secondary | ICD-10-CM | POA: Diagnosis not present

## 2015-05-06 DIAGNOSIS — I10 Essential (primary) hypertension: Secondary | ICD-10-CM | POA: Diagnosis not present

## 2015-05-06 DIAGNOSIS — F418 Other specified anxiety disorders: Secondary | ICD-10-CM | POA: Diagnosis not present

## 2015-05-06 DIAGNOSIS — Z8673 Personal history of transient ischemic attack (TIA), and cerebral infarction without residual deficits: Secondary | ICD-10-CM | POA: Diagnosis not present

## 2015-05-06 HISTORY — DX: Benign endometrial hyperplasia: N85.01

## 2015-05-06 HISTORY — PX: HYSTEROSCOPY W/D&C: SHX1775

## 2015-05-06 SURGERY — DILATATION AND CURETTAGE /HYSTEROSCOPY
Anesthesia: General

## 2015-05-06 MED ORDER — PHENYLEPHRINE HCL 10 MG/ML IJ SOLN
INTRAMUSCULAR | Status: DC | PRN
  Administered 2015-05-06 (×3): 40 ug via INTRAVENOUS

## 2015-05-06 MED ORDER — FENTANYL CITRATE (PF) 250 MCG/5ML IJ SOLN
INTRAMUSCULAR | Status: DC | PRN
  Administered 2015-05-06 (×2): 50 ug via INTRAVENOUS

## 2015-05-06 MED ORDER — HYDROMORPHONE HCL 1 MG/ML IJ SOLN: 0.2500 mg | INTRAMUSCULAR | Status: DC | PRN

## 2015-05-06 MED ORDER — LIDOCAINE HCL (CARDIAC) 20 MG/ML IV SOLN
INTRAVENOUS | Status: DC | PRN
  Administered 2015-05-06: 60 mg via INTRAVENOUS

## 2015-05-06 MED ORDER — GLYCOPYRROLATE 0.2 MG/ML IJ SOLN
INTRAMUSCULAR | Status: DC | PRN
  Administered 2015-05-06: 0.1 mg via INTRAVENOUS

## 2015-05-06 MED ORDER — MEPERIDINE HCL 25 MG/ML IJ SOLN: 6.2500 mg | INTRAMUSCULAR | Status: DC | PRN

## 2015-05-06 MED ORDER — PROPOFOL 10 MG/ML IV BOLUS
INTRAVENOUS | Status: DC | PRN
  Administered 2015-05-06 (×2): 25 mg via INTRAVENOUS
  Administered 2015-05-06: 100 mg via INTRAVENOUS
  Administered 2015-05-06: 50 mg via INTRAVENOUS

## 2015-05-06 MED ORDER — ONDANSETRON HCL 4 MG/2ML IJ SOLN
INTRAMUSCULAR | Status: DC | PRN
  Administered 2015-05-06: 4 mg via INTRAVENOUS

## 2015-05-06 MED ORDER — PROPOFOL 10 MG/ML IV BOLUS
INTRAVENOUS | Status: AC
  Filled 2015-05-06: qty 20

## 2015-05-06 MED ORDER — FENTANYL CITRATE (PF) 100 MCG/2ML IJ SOLN
INTRAMUSCULAR | Status: AC
  Filled 2015-05-06: qty 2

## 2015-05-06 MED ORDER — LACTATED RINGERS IV SOLN
INTRAVENOUS | Status: DC
  Administered 2015-05-06 (×2): via INTRAVENOUS

## 2015-05-06 MED ORDER — LIDOCAINE HCL (CARDIAC) 20 MG/ML IV SOLN
INTRAVENOUS | Status: AC
  Filled 2015-05-06: qty 5

## 2015-05-06 MED ORDER — ONDANSETRON HCL 4 MG/2ML IJ SOLN: 4.0000 mg | Freq: Once | INTRAMUSCULAR | Status: DC | PRN

## 2015-05-06 MED ORDER — ONDANSETRON HCL 4 MG/2ML IJ SOLN
INTRAMUSCULAR | Status: AC
  Filled 2015-05-06: qty 2

## 2015-05-06 SURGICAL SUPPLY — 20 items
CANISTER SUCT 3000ML (MISCELLANEOUS) ×3 IMPLANT
CATH ROBINSON RED A/P 16FR (CATHETERS) ×3 IMPLANT
CLOTH BEACON ORANGE TIMEOUT ST (SAFETY) ×3 IMPLANT
CONTAINER PREFILL 10% NBF 60ML (FORM) ×6 IMPLANT
DRAPE STERI URO 9X17 APER PCH (DRAPES) ×3 IMPLANT
ELECT REM PT RETURN 9FT ADLT (ELECTROSURGICAL) ×3
ELECTRODE REM PT RTRN 9FT ADLT (ELECTROSURGICAL) ×1 IMPLANT
ELECTRODE ROLLER VERSAPOINT (ELECTRODE) IMPLANT
ELECTRODE RT ANGLE VERSAPOINT (CUTTING LOOP) IMPLANT
GLOVE BIO SURGEON STRL SZ7 (GLOVE) ×3 IMPLANT
GLOVE BIOGEL PI IND STRL 7.0 (GLOVE) ×3 IMPLANT
GLOVE BIOGEL PI INDICATOR 7.0 (GLOVE) ×6
GOWN STRL REUS W/TWL LRG LVL3 (GOWN DISPOSABLE) ×6 IMPLANT
PACK VAGINAL MINOR WOMEN LF (CUSTOM PROCEDURE TRAY) ×3 IMPLANT
PAD OB MATERNITY 4.3X12.25 (PERSONAL CARE ITEMS) ×3 IMPLANT
PIPET BIOPSY ENDOMETRIAL 3MM (SUCTIONS) ×3 IMPLANT
TOWEL OR 17X24 6PK STRL BLUE (TOWEL DISPOSABLE) ×6 IMPLANT
TUBING AQUILEX INFLOW (TUBING) ×3 IMPLANT
TUBING AQUILEX OUTFLOW (TUBING) ×3 IMPLANT
WATER STERILE IRR 1000ML POUR (IV SOLUTION) ×3 IMPLANT

## 2015-05-06 NOTE — Anesthesia Postprocedure Evaluation (Incomplete)
Anesthesia Post Note  Patient: Cassidy Jones  Procedure(s) Performed: Procedure(s) (LRB): DILATATION AND CURETTAGE /HYSTEROSCOPY (N/A)  Anesthesia Post Evaluation  Last Vitals:  Filed Vitals:   05/06/15 1100 05/06/15 1145  BP: 125/77 128/61  Pulse: 66 64  Temp: 36.5 C 36.6 C  Resp: 16 18    Last Pain:  Filed Vitals:   05/06/15 1148  PainSc: 3                  Brewer Hitchman D

## 2015-05-06 NOTE — Anesthesia Preprocedure Evaluation (Signed)
Anesthesia Evaluation  Patient identified by MRN, date of birth, ID band Patient awake    Reviewed: Allergy & Precautions, NPO status , Patient's Chart, lab work & pertinent test results  Airway Mallampati: I  TM Distance: >3 FB Neck ROM: Full    Dental   Pulmonary    Pulmonary exam normal        Cardiovascular hypertension, Pt. on medications Normal cardiovascular exam     Neuro/Psych Anxiety Depression CVA, No Residual Symptoms    GI/Hepatic   Endo/Other  Hypothyroidism   Renal/GU      Musculoskeletal   Abdominal   Peds  Hematology   Anesthesia Other Findings   Reproductive/Obstetrics                             Anesthesia Physical Anesthesia Plan  ASA: III  Anesthesia Plan: General   Post-op Pain Management:    Induction: Intravenous  Airway Management Planned: LMA  Additional Equipment:   Intra-op Plan:   Post-operative Plan: Extubation in OR  Informed Consent: I have reviewed the patients History and Physical, chart, labs and discussed the procedure including the risks, benefits and alternatives for the proposed anesthesia with the patient or authorized representative who has indicated his/her understanding and acceptance.     Plan Discussed with: CRNA and Surgeon  Anesthesia Plan Comments:         Anesthesia Quick Evaluation

## 2015-05-06 NOTE — Op Note (Signed)
Preoperative diagnosis: Complex endometrial hyperplasia with focal atypia. Postmenopausal female.  Postop diagnosis: as above.  Procedure: Hysteroscopic endometrial biopsy and D&C Anesthesia General via LMA  Surgeon: Shea EvansVaishali Jatorian Renault, MD  Assistant: none  IV fluids  700 cc LR Estimated blood loss  3 cc  Urine output: straight catheter preop  30 cc Hysteroscopic fluid deficit: Saline 40 cc  Complications none  Condition stable  Disposition PACU  Specimen: Endometrial biopsy and endometrial curettings   Procedure  Indication: Postmenopausal female using unopposed Estrogen replacement therapy with office sonogram noting thick endometrial stripe and office endometrial biopsy noted Complex endometrial hyperplasia with focal atypia. Since then, patient is taking Prometrium.  Patient was counseled on risks/ complications including infection, bleeding, damage to internal organs, she understood and agrees, gave informed written consent.  Patient was brought to the operating room with IV running. Time out was carried out. She received preop 1 gm Ancef. She underwent general anesthesia via LMA without complications. She was given dorsolithotomy position. Parts were prepped and draped in standard fashion. Bladder was catheterized once.  Buttock drape placed to cover rectum and betadine soaked gauze placed over vaginal part of recto-vaginal fistula.  Bimanual exam revealed short flushed cervix, uterus to be anteverted and normal size. Speculum was placed and cervix was grasped with single-tooth tenaculum. Speculum removed and Sim's vaginal retractor placed and small Deaver used to retract anterior vaginal wall. Cervical opening was palpated. Cervical os was dilated carefully with dilators and uterus was sounded to 7 cm. Cervical os was dilated to 18 JamaicaFrench dilator. Posterior lip tenaculum placed as well to bring the os more anteriorly and then I was able to introduce Hysteroscope in the uterine cavity under  vision, using saline irrigation.   Findings:  Endometrial cavity was red in certain areas and pale in other areas, filmy endometrial tissue was grasped with hysteroscopic graspers and removed for pathology. Both tubal ostii seen well. There were no polyps/ thick areas/ growth in the cavity.    Hysteroscope was removed. Endometrial curettage was performed with sharp curette followed by inserting endometrial Pipelle to aspirate the tissue as tissue return with curettage was minimal. . All tissue sent to path.   Fluid deficit 40 cc.  All counts are correct x2. No complications. Patient brought to the recovery room in stable condition.  Patient will be discharged home today.  Follow up in 2 weeks in office with Dr Ernestina PennaFogleman. Warning signs of infection and excessive bleeding reviewed.   V.Revonda Menter, MD.

## 2015-05-06 NOTE — Discharge Instructions (Signed)
Dilation and Curettage or Vacuum Curettage, Care After °These instructions give you information on caring for yourself after your procedure. Your doctor may also give you more specific instructions. Call your doctor if you have any problems or questions after your procedure. °HOME CARE °· Do not drive for 24 hours. °· Wait 1 week before doing any activities that wear you out. °· Take your temperature 2 times a day for 4 days. Write it down. Tell your doctor if you have a fever. °· Do not stand for a long time. °· Do not lift, push, or pull anything over 10 pounds (4.5 kilograms). °· Limit stair climbing to once or twice a day. °· Rest often. °· Continue with your usual diet. °· Drink enough fluids to keep your pee (urine) clear or pale yellow. °· If you have a hard time pooping (constipation), you may: °¨ Take a medicine to help you go poop (laxative) as told by your doctor. °¨ Eat more fruit and bran. °¨ Drink more fluids. °· Take showers, not baths, for as long as told by your doctor. °· Do not swim or use a hot tub until your doctor says it is okay. °· Have someone with you for 1-2 days after the procedure. °· Do not douche, use tampons, or have sex (intercourse) for 2 weeks. °· Only take medicines as told by your doctor. Do not take aspirin. It can cause bleeding. °· Keep all doctor visits. °GET HELP IF: °· You have cramps or pain not helped by medicine. °· You have new pain in the belly (abdomen). °· You have a bad smelling fluid coming from your vagina. °· You have a rash. °· You have problems with any medicine. °GET HELP RIGHT AWAY IF:  °· You start to bleed more than a regular period. °· You have a fever. °· You have chest pain. °· You have trouble breathing. °· You feel dizzy or feel like passing out (fainting). °· You pass out. °· You have pain in the tops of your shoulders. °· You have vaginal bleeding with or without clumps of blood (blood clots). °MAKE SURE YOU: °· Understand these instructions. °· Will  watch your condition. °· Will get help right away if you are not doing well or get worse. °  °This information is not intended to replace advice given to you by your health care provider. Make sure you discuss any questions you have with your health care provider. °  °Document Released: 11/01/2007 Document Revised: 01/27/2013 Document Reviewed: 08/21/2012 °Elsevier Interactive Patient Education ©2016 [employer] ° °

## 2015-05-06 NOTE — Anesthesia Procedure Notes (Signed)
Procedure Name: LMA Insertion Date/Time: 05/06/2015 9:15 AM Performed by: Graciela HusbandsFUSSELL, Jovani Flury O Pre-anesthesia Checklist: Patient identified, Timeout performed, Emergency Drugs available, Suction available and Patient being monitored Patient Re-evaluated:Patient Re-evaluated prior to inductionOxygen Delivery Method: Circle system utilized Preoxygenation: Pre-oxygenation with 100% oxygen Intubation Type: IV induction Ventilation: Mask ventilation with difficulty LMA: LMA inserted LMA Size: 4.0 Number of attempts: 1 Placement Confirmation: positive ETCO2 and breath sounds checked- equal and bilateral Tube secured with: Tape Dental Injury: Teeth and Oropharynx as per pre-operative assessment  Difficulty Due To: Difficulty was unanticipated, Difficult Airway- due to limited oral opening and Difficult Airway- due to reduced neck mobility Comments: Oral opening was less post-induction than pre-operative airway exam making LMA placement more difficult than anticipated.This also made oral airway insertion and ventilation more difficult..Marland Kitchen

## 2015-05-06 NOTE — Anesthesia Postprocedure Evaluation (Signed)
Anesthesia Post Note  Patient: Cassidy Jones  Procedure(s) Performed: Procedure(s) (LRB): DILATATION AND CURETTAGE /HYSTEROSCOPY (N/A)  Patient location during evaluation: PACU Anesthesia Type: General Level of consciousness: awake and alert and oriented Pain management: pain level controlled Vital Signs Assessment: post-procedure vital signs reviewed and stable Respiratory status: spontaneous breathing, nonlabored ventilation and respiratory function stable Cardiovascular status: blood pressure returned to baseline and stable Postop Assessment: no signs of nausea or vomiting Anesthetic complications: no    Last Vitals:  Filed Vitals:   05/06/15 1100 05/06/15 1145  BP: 125/77 128/61  Pulse: 66 64  Temp: 36.5 C 36.6 C  Resp: 16 18    Last Pain:  Filed Vitals:   05/06/15 1148  PainSc: 3                  Shatoria Stooksbury A.

## 2015-05-06 NOTE — H&P (Signed)
Cassidy Jones is an 70 y.o. female.here for hysteroscopy and D&C for complex endometrial hyperplasia on office biopsy. Patient is on HRT, taking Estrogen but not progestins, office sono noted thick endometrial stripe for which endometrial biopsy was done. Patient denies vaginal bleeding.  She has recto-vaginal fistula and drains off and on.  No LMP recorded. Patient is postmenopausal.    Past Medical History  Diagnosis Date  . Thyroid disease     thyroid gland removed  . Depression   . Anxiety   . ADHD (attention deficit hyperactivity disorder)   . Hypothyroidism   . Hyperlipidemia   . Hypertension   . Stroke [facility](HCC)     years ago, no deficits  . Renal disorder     stage 3 kidney disease  . Arthritis     hands, feet, neck  . [facility]    [facility]  . UC (ulcerative colitis) (HCC)     and colon polyps    Past Surgical History  Procedure Laterality Date  . Cataract surgery Bilateral   . Colon removed      x 4 surgeries  Hx UC and colon polyps  . Eye surgery    . Goiters removed      x 2 at age 70, age 70  . Wisdom tooth extraction    . Colon surgery    . Colonoscopy      History reviewed. No pertinent family history.  Social History:  reports that she has never smoked. She has never used smokeless tobacco. She reports that she drinks about 6.0 oz of alcohol per week. She reports that she does not use illicit drugs.  Allergies:  Allergies  Allergen Reactions  . Metronidazole     Cannot take the tablets due to numbness in her toes. Can take active ingredient     Prescriptions prior to admission  Medication Sig Dispense Refill Last Dose  . Opium Tincture, Paregoric, 2 MG/5ML TINC Take 5 mLs by mouth 3 (three) times daily. pain   05/06/2015 at 0730  . SYNTHROID 75 MCG tablet Take 1 tablet by mouth daily.   05/06/2015 at 0730  . calcitonin, salmon, (MIACALCIN/FORTICAL) 200 UNIT/ACT nasal spray Place 1 spray into alternate nostrils daily.     11/12/2013 at Unknown time  . calcitRIOL (ROCALTROL) 0.25 MCG capsule Take 0.25-0.5 mcg by mouth See admin instructions. Patient alternates between 0.4925mcg and 0.375mcg daily   11/12/2013 at Unknown time  . calcium carbonate 200 MG capsule Take 300 mg by mouth daily.   11/12/2013 at Unknown time  . cyclobenzaprine (FLEXERIL) 10 MG tablet Take 1 tablet (10 mg total) by mouth 2 (two) times daily as needed for muscle spasms. (Patient not taking: Reported on 04/21/2015) 20 tablet 0   . estradiol (CLIMARA - DOSED IN MG/24 HR) 0.025 mg/24hr patch Place 1 patch onto the skin once a week.    Past Week at Unknown time  . felodipine (PLENDIL) 5 MG 24 hr tablet Take 2.5 tablets by mouth at bedtime.    11/12/2013 at Unknown time  . FLUoxetine (PROZAC) 20 MG capsule Take 1 capsule by mouth daily. Takes at 1pm daily   11/12/2013 at Unknown time  . gabapentin (NEURONTIN) 300 MG capsule Take 1 capsule by mouth 3 (three) times daily. Takes at 10am, 5pm and midnight daily   11/12/2013 at Unknown time  . LORazepam (ATIVAN) 1 MG tablet Take 0.25-1 tablets by mouth 3 (three) times daily. 1 tab mid-morning,  1/2 tab in the afternoon and 1/4 tab at bedtime as needed for anxiety   11/12/2013 at Unknown time  . metroNIDAZOLE (METROGEL) 0.75 % vaginal gel Place 1 Applicatorful vaginally See admin instructions. 5 times per week   Past Week at Unknown time  . Multiple Vitamin (MULTIVITAMIN WITH MINERALS) TABS tablet Take 1 tablet by mouth daily.   11/12/2013 at Unknown time  . potassium chloride SA (K-DUR,KLOR-CON) 20 MEQ tablet Take 1 tablet by mouth every evening.    11/11/2013 at Unknown time  . pravastatin (PRAVACHOL) 20 MG tablet Take 1 tablet by mouth every evening.    11/11/2013 at Unknown time  . PREMARIN vaginal cream Place 1 Applicatorful vaginally 2 (two) times a week.   Past Week at Unknown time  . PRESCRIPTION MEDICATION Place 1 suppository rectally See admin instructions. Metronidazole  Suppository: 1 suppository per rectum  5 times per week.   Past Week at Unknown time  . terconazole (TERAZOL 7) 0.4 % vaginal cream Place 1 applicator vaginally once a week.    11/11/2013 at Unknown time  . vitamin B-12 (CYANOCOBALAMIN) 1000 MCG tablet Take 1,000 mcg by mouth daily.   11/12/2013 at Unknown time  . VYVANSE 40 MG capsule Take 25 mg by mouth daily.    11/12/2013 at Unknown time  . zolpidem (AMBIEN) 5 MG tablet Take 1 tablet by mouth at bedtime as needed. sleep   Past Week at Unknown time    ROS  Neg   Blood pressure 118/78, pulse 70, temperature 98.1 F (36.7 C), temperature source Oral, resp. rate 20, SpO2 99 %. Physical Exam A&O x 3, no acute distress. Pleasant Lungs CTA bilat CV RRR, S1S2 normal Abdo soft, non tender, non acute Extr no edema/ tenderness Pelvic Per office report flushed cervix to vaginal walls, posterior uterine myoma distorting cervical canal.   Assessment/Plan: 70 yo postmenopausal female with complex endometrial hyperplasia, taking unopposed estrogen therapy. She is here for further evaluation with hysteroscopy and D&C. Risks/complications of surgery reviewed incl infection, bleeding, damage to internal organs including bladder, bowels, ureters, blood vessels, other risks from anesthesia, VTE and delayed complications of any surgery, complications in future surgery reviewed.    Cassidy Jones R 05/06/2015, 8:52 AM

## 2015-05-06 NOTE — Transfer of Care (Signed)
Immediate Anesthesia Transfer of Care Note  Patient: Cassidy Jones  Procedure(s) Performed: Procedure(s): DILATATION AND CURETTAGE /HYSTEROSCOPY (N/A)  Patient Location: PACU  Anesthesia Type:General  Level of Consciousness: awake, alert , oriented and patient cooperative  Airway & Oxygen Therapy: Patient Spontanous Breathing and Patient connected to nasal cannula oxygen  Post-op Assessment: Report given to RN and Post -op Vital signs reviewed and stable  Post vital signs: Reviewed and stable  Last Vitals:  Filed Vitals:   05/06/15 0803 05/06/15 1006  BP: 118/78 129/78  Pulse: 70 73  Temp: 36.7 C 36.6 C  Resp: 20 16    Complications: No apparent anesthesia complications

## 2015-05-09 ENCOUNTER — Encounter (HOSPITAL_COMMUNITY): Payer: Self-pay | Admitting: Obstetrics & Gynecology

## 2015-05-09 NOTE — Anesthesia Postprocedure Evaluation (Signed)
Anesthesia Post Note  Patient: Cassidy Jones  Procedure(s) Performed: Procedure(s) (LRB): DILATATION AND CURETTAGE /HYSTEROSCOPY (N/A)  Patient location during evaluation: PACU Anesthesia Type: General Level of consciousness: awake and alert Pain management: pain level controlled Vital Signs Assessment: post-procedure vital signs reviewed and stable Respiratory status: spontaneous breathing, nonlabored ventilation, respiratory function stable and patient connected to nasal cannula oxygen Cardiovascular status: blood pressure returned to baseline and stable Postop Assessment: no signs of nausea or vomiting Anesthetic complications: no    Last Vitals:  Filed Vitals:   05/06/15 1100 05/06/15 1145  BP: 125/77 128/61  Pulse: 66 64  Temp: 36.5 C 36.6 C  Resp: 16 18    Last Pain:  Filed Vitals:   05/07/15 1625  PainSc: 0-No pain                 Cassidy Jones

## 2015-12-23 ENCOUNTER — Ambulatory Visit (INDEPENDENT_AMBULATORY_CARE_PROVIDER_SITE_OTHER): Payer: Self-pay | Admitting: Orthopaedic Surgery

## 2016-01-05 ENCOUNTER — Telehealth (INDEPENDENT_AMBULATORY_CARE_PROVIDER_SITE_OTHER): Payer: Self-pay | Admitting: Orthopaedic Surgery

## 2016-01-05 NOTE — Telephone Encounter (Signed)
Sure thing!

## 2016-01-05 NOTE — Telephone Encounter (Signed)
Is this okay?

## 2016-01-06 ENCOUNTER — Ambulatory Visit (INDEPENDENT_AMBULATORY_CARE_PROVIDER_SITE_OTHER): Payer: Managed Care, Other (non HMO)

## 2016-01-06 ENCOUNTER — Ambulatory Visit (INDEPENDENT_AMBULATORY_CARE_PROVIDER_SITE_OTHER): Payer: Managed Care, Other (non HMO) | Admitting: Orthopaedic Surgery

## 2016-01-06 ENCOUNTER — Encounter (INDEPENDENT_AMBULATORY_CARE_PROVIDER_SITE_OTHER): Payer: Self-pay | Admitting: Orthopaedic Surgery

## 2016-01-06 DIAGNOSIS — M2021 Hallux rigidus, right foot: Secondary | ICD-10-CM | POA: Diagnosis not present

## 2016-01-06 DIAGNOSIS — M79672 Pain in left foot: Secondary | ICD-10-CM | POA: Diagnosis not present

## 2016-01-06 DIAGNOSIS — M2022 Hallux rigidus, left foot: Secondary | ICD-10-CM | POA: Diagnosis not present

## 2016-01-06 MED ORDER — METHYLPREDNISOLONE ACETATE 40 MG/ML IJ SUSP
13.3300 mg | INTRAMUSCULAR | Status: AC | PRN
  Administered 2016-01-06: 13.33 mg via INTRA_ARTICULAR

## 2016-01-06 MED ORDER — LIDOCAINE HCL 1 % IJ SOLN
0.3000 mL | INTRAMUSCULAR | Status: AC | PRN
  Administered 2016-01-06: .3 mL

## 2016-01-06 MED ORDER — BUPIVACAINE HCL 0.5 % IJ SOLN
0.3300 mL | INTRAMUSCULAR | Status: AC | PRN
  Administered 2016-01-06: .33 mL via INTRA_ARTICULAR

## 2016-01-06 NOTE — Progress Notes (Signed)
Office Visit Note   Patient: Cassidy Jones           Date of Birth: 08/14/1945           MRN: [id number] Visit Date: 01/06/2016              Requested by: Rodrigo RanMark Perini, MD [address] [place], [place] [id number] PCP: Ezequiel KayserPERINI,MARK A, MD   Assessment & Plan: Visit Diagnoses:  1. Hallux rigidus of left foot   2. Hallux rigidus, right foot     Plan: Bilateral injections into the first metatarsal phalangeal joint were performed under sterile conditions. Follow up as needed.  Follow-Up Instructions: Return if symptoms worsen or fail to improve.   Orders:  Orders Placed This Encounter  Procedures  . XR Foot Complete Left   No orders of the defined types were placed in this encounter.     Procedures: Small Joint Inj Date/Time: 01/06/2016 5:51 PM Performed by: Tarry KosXU, Lorea Kupfer M Authorized by: Tarry KosXU, Tymeer Vaquera M   Consent Given by:  Patient Indications:  Pain Location:  Great toe Site:  R great MTP Needle Size:  25 G Approach:  Dorsal Ultrasound Guided: No   Fluoroscopic Guidance: No   Medications:  0.3 mL lidocaine 1 %; 0.33 mL bupivacaine 0.5 %; 13.33 mg methylPREDNISolone acetate 40 MG/ML Patient tolerance:  Patient tolerated the procedure well with no immediate complications Small Joint Inj Date/Time: 01/06/2016 5:52 PM Performed by: Tarry KosXU, Saamiya Jeppsen M Authorized by: Tarry KosXU, Shanen Norris M   Consent Given by:  Patient Indications:  Pain Location:  Great toe Site:  L great MTP Needle Size:  25 G Approach:  Dorsal Ultrasound Guided: No   Fluoroscopic Guidance: No   Medications:  0.3 mL lidocaine 1 %; 0.33 mL bupivacaine 0.5 %; 13.33 mg methylPREDNISolone acetate 40 MG/ML Patient tolerance:  Patient tolerated the procedure well with no immediate complications     Clinical Data: No additional findings.   Subjective: Chief Complaint  Patient presents with  . Left Foot - Pain  . Right Foot - Pain    The patient comes in today for bilateral hallux rigidus worse on the  right. She would like another injection.    Review of Systems   Objective: Vital Signs: There were no vitals taken for this visit.  Physical Exam  Ortho Exam Exam of bilateral feet Specialty Comments:  No specialty comments available.  Imaging: Xr Foot Complete Left  Result Date: 01/06/2016 Left hallux rigidus    PMFS History: Patient Active Problem List   Diagnosis Date Noted  . Pain in left foot 01/06/2016  . Hallux rigidus, right foot 01/06/2016  . Complex endometrial hyperplasia without atypia 05/06/2015   Past Medical History:  Diagnosis Date  . ADHD (attention deficit hyperactivity disorder)   . Anxiety   . Arthritis    hands, feet, neck  . Complex endometrial hyperplasia without atypia 05/06/2015  . Depression   . [facility] of blood transfusion 2005   [facility]  . Hyperlipidemia   . Hypertension   . Hypothyroidism   . Renal disorder    stage 3 kidney disease  . Stroke [facility](HCC)    years ago, no deficits  . Thyroid disease    thyroid gland removed  . UC (ulcerative colitis) (HCC)    and colon polyps    No family history on file.  Past Surgical History:  Procedure Laterality Date  . cataract surgery Bilateral   . colon removed     x  4 surgeries  Hx UC and colon polyps  . COLON SURGERY    . COLONOSCOPY    . EYE SURGERY    . goiters removed     x 2 at age 70, age 70  . HYSTEROSCOPY W/D&C N/A 05/06/2015   Procedure: DILATATION AND CURETTAGE /HYSTEROSCOPY;  Surgeon: Shea EvansVaishali Mody, MD;  Location: [facility];  Service: Gynecology;  Laterality: N/A;  . WISDOM TOOTH EXTRACTION     Social History   Occupational History  . Not on file.   Social History Main Topics  . Smoking status: Never Smoker  . Smokeless tobacco: Never Used  . Alcohol use 6.0 oz/week    10 Glasses of wine per week     Comment: wine  6 oz wine every night  . Drug use: No  . Sexual activity: Yes    Birth control/ protection: Post-menopausal

## 2016-01-06 NOTE — Telephone Encounter (Signed)
[facility]

## 2016-01-09 NOTE — Telephone Encounter (Signed)
yes

## 2016-01-09 NOTE — Telephone Encounter (Signed)
Can she get Rx for gel?

## 2016-01-09 NOTE — Telephone Encounter (Signed)
Patient says she forgot to ask for an rx of  volteran gel.  [facility].  Cb# [phone]

## 2016-01-10 MED ORDER — DICLOFENAC SODIUM 1 % TD GEL: 2.0000 g | Freq: Four times a day (QID) | TRANSDERMAL | 0 refills | Status: AC

## 2016-01-10 NOTE — Telephone Encounter (Signed)
rx sent to pharm

## 2016-01-10 NOTE — Telephone Encounter (Signed)
Called pt to let her know.

## 2016-05-29 ENCOUNTER — Ambulatory Visit (INDEPENDENT_AMBULATORY_CARE_PROVIDER_SITE_OTHER): Payer: Managed Care, Other (non HMO) | Admitting: Orthopaedic Surgery

## 2016-05-29 ENCOUNTER — Encounter (INDEPENDENT_AMBULATORY_CARE_PROVIDER_SITE_OTHER): Payer: Self-pay | Admitting: Orthopaedic Surgery

## 2016-05-29 DIAGNOSIS — M2021 Hallux rigidus, right foot: Secondary | ICD-10-CM | POA: Diagnosis not present

## 2016-05-29 MED ORDER — METHYLPREDNISOLONE ACETATE 40 MG/ML IJ SUSP
13.3300 mg | INTRAMUSCULAR | Status: AC | PRN
  Administered 2016-05-29: 13.33 mg via INTRA_ARTICULAR

## 2016-05-29 MED ORDER — BUPIVACAINE HCL 0.5 % IJ SOLN
0.3300 mL | INTRAMUSCULAR | Status: AC | PRN
  Administered 2016-05-29: .33 mL via INTRA_ARTICULAR

## 2016-05-29 MED ORDER — LIDOCAINE HCL 1 % IJ SOLN
0.3000 mL | INTRAMUSCULAR | Status: AC | PRN
  Administered 2016-05-29: .3 mL

## 2016-05-29 NOTE — Progress Notes (Signed)
Office Visit Note   Patient: Cassidy Jones           Date of Birth: 04/16/1945           MRN: [id number] Visit Date: 05/29/2016              Requested by: Rodrigo Ran, MD [address] [place], [place] [address] PCP: Ezequiel Kayser, MD   Assessment & Plan: Visit Diagnoses:  1. Hallux rigidus, right foot     Plan: Great toe MTP joint was injected today. Patient tolerated as well as follow-up with me as needed.  Follow-Up Instructions: Return if symptoms worsen or fail to improve.   Orders:  No orders of the defined types were placed in this encounter.  No orders of the defined types were placed in this encounter.     Procedures: Small Joint Inj Date/Time: 05/29/2016 3:35 PM Performed by: Tarry Kos Authorized by: Tarry Kos   Consent Given by:  Patient Indications:  Pain Location:  Great toe Site:  R great MTP Needle Size:  25 G Approach:  Dorsal Ultrasound Guided: No   Fluoroscopic Guidance: No   Medications:  13.33 mg methylPREDNISolone acetate 40 MG/ML; 0.33 mL bupivacaine 0.5 %; 0.3 mL lidocaine 1 %     Clinical Data: No additional findings.   Subjective: Chief Complaint  Patient presents with  . Right Foot - Pain    Patient comes in today for continued pain with right hallux rigidus. She would like another injection. She stubbed it a couple times since we last saw her. Denies any new numbness or times.    Review of Systems   Objective: Vital Signs: There were no vitals taken for this visit.  Physical Exam  Ortho Exam Right foot exam and right great toe exam are stable. She has positive grind test. Pain with range of motion. Specialty Comments:  No specialty comments available.  Imaging: No results found.   PMFS History: Patient Active Problem List   Diagnosis Date Noted  . Pain in left foot 01/06/2016  . Hallux rigidus, right foot 01/06/2016  . Complex endometrial hyperplasia without atypia 05/06/2015   Past Medical  History:  Diagnosis Date  . ADHD (attention deficit hyperactivity disorder)   . Anxiety   . Arthritis    hands, feet, neck  . Complex endometrial hyperplasia without atypia 05/06/2015  . Depression   . History of blood transfusion 2005   at [facility]  . Hyperlipidemia   . Hypertension   . Hypothyroidism   . Renal disorder    stage 3 kidney disease  . Stroke [facility])    years ago, no deficits  . Thyroid disease    thyroid gland removed  . UC (ulcerative colitis) (HCC)    and colon polyps    No family history on file.  Past Surgical History:  Procedure Laterality Date  . cataract surgery Bilateral   . colon removed     x 4 surgeries  Hx UC and colon polyps  . COLON SURGERY    . COLONOSCOPY    . EYE SURGERY    . goiters removed     x 2 at age 17, age 70  . HYSTEROSCOPY W/D&C N/A 05/06/2015   Procedure: DILATATION AND CURETTAGE /HYSTEROSCOPY;  Surgeon: Shea Evans, MD;  Location: [facility];  Service: Gynecology;  Laterality: N/A;  . WISDOM TOOTH EXTRACTION     Social History   Occupational History  . Not on file.  Social History Main Topics  . Smoking status: Never Smoker  . Smokeless tobacco: Never Used  . Alcohol use 6.0 oz/week    10 Glasses of wine per week     Comment: wine  6 oz wine every night  . Drug use: No  . Sexual activity: Yes    Birth control/ protection: Post-menopausal

## 2016-12-24 ENCOUNTER — Ambulatory Visit (INDEPENDENT_AMBULATORY_CARE_PROVIDER_SITE_OTHER): Payer: Managed Care, Other (non HMO) | Admitting: Orthopaedic Surgery

## 2016-12-24 DIAGNOSIS — M2021 Hallux rigidus, right foot: Secondary | ICD-10-CM | POA: Diagnosis not present

## 2016-12-24 MED ORDER — LIDOCAINE HCL 1 % IJ SOLN
0.3000 mL | INTRAMUSCULAR | Status: AC | PRN
  Administered 2016-12-24: .3 mL

## 2016-12-24 MED ORDER — METHYLPREDNISOLONE ACETATE 40 MG/ML IJ SUSP
13.3300 mg | INTRAMUSCULAR | Status: AC | PRN
  Administered 2016-12-24: 13.33 mg via INTRA_ARTICULAR

## 2016-12-24 MED ORDER — BUPIVACAINE HCL 0.5 % IJ SOLN
0.3300 mL | INTRAMUSCULAR | Status: AC | PRN
  Administered 2016-12-24: .33 mL via INTRA_ARTICULAR

## 2016-12-24 NOTE — Progress Notes (Signed)
Office Visit Note   Patient: Cassidy Jones           Date of Birth: 04/04/1945           MRN: [id number] Visit Date: 12/24/2016              Requested by: Rodrigo RanPerini, Mark, MD [address] [place], [place] [id number] PCP: Rodrigo RanPerini, Mark, MD   Assessment & Plan: Visit Diagnoses:  1. Hallux rigidus, right foot     Plan: Right great toe MTP joint was injected today.  Patient tolerated well.  Follow-Up Instructions: Return if symptoms worsen or fail to improve.   Orders:  No orders of the defined types were placed in this encounter.  No orders of the defined types were placed in this encounter.     Procedures: Small Joint Inj: R great MTP on 12/24/2016 3:11 PM Indications: pain Details: 25 G needle Medications: 0.3 mL lidocaine 1 %; 0.33 mL bupivacaine 0.5 %; 13.33 mg methylPREDNISolone acetate 40 MG/ML Outcome: tolerated well, no immediate complications Patient was prepped and draped in the usual sterile fashion.       Clinical Data: No additional findings.   Subjective: Chief Complaint  Patient presents with  . Right Foot - Pain    Patient comes in today for injection of her right hallux rigidus.  She denies any numbness or tingling.    Review of Systems   Objective: Vital Signs: There were no vitals taken for this visit.  Physical Exam  Ortho Exam Stable exam of the right great toe Specialty Comments:  No specialty comments available.  Imaging: No results found.   PMFS History: Patient Active Problem List   Diagnosis Date Noted  . Pain in left foot 01/06/2016  . Hallux rigidus, right foot 01/06/2016  . Complex endometrial hyperplasia without atypia 05/06/2015   Past Medical History:  Diagnosis Date  . ADHD (attention deficit hyperactivity disorder)   . Anxiety   . Arthritis    hands, feet, neck  . Complex endometrial hyperplasia without atypia 05/06/2015  . Depression   . [facility] 2005   [facility]  .  Hyperlipidemia   . Hypertension   . Hypothyroidism   . Renal disorder    stage 3 kidney disease  . Stroke [facility])    years ago, no deficits  . Thyroid disease    thyroid gland removed  . UC (ulcerative colitis) (HCC)    and colon polyps    No family history on file.  Past Surgical History:  Procedure Laterality Date  . cataract surgery Bilateral   . colon removed     x 4 surgeries  Hx UC and colon polyps  . COLON SURGERY    . COLONOSCOPY    . DILATATION AND CURETTAGE /HYSTEROSCOPY N/A 05/06/2015   Performed [facility] Shea EvansMody, Vaishali[facility]  . EYE SURGERY    . goiters removed     x 2 at age 71, age 71  . WISDOM TOOTH EXTRACTION     Social History   Occupational History  . Not on file  Tobacco Use  . Smoking status: Never Smoker  . Smokeless tobacco: Never Used  Substance and Sexual Activity  . Alcohol use: Yes    Alcohol/week: 6.0 oz    Types: 10 Glasses of wine per week    Comment: wine  6 oz wine every night  . Drug use: No  . Sexual activity: Yes    Birth  control/protection: Post-menopausal

## 2017-07-23 ENCOUNTER — Ambulatory Visit (INDEPENDENT_AMBULATORY_CARE_PROVIDER_SITE_OTHER): Payer: Managed Care, Other (non HMO) | Admitting: Orthopaedic Surgery

## 2017-07-23 ENCOUNTER — Encounter (INDEPENDENT_AMBULATORY_CARE_PROVIDER_SITE_OTHER): Payer: Self-pay | Admitting: Orthopaedic Surgery

## 2017-07-23 DIAGNOSIS — M2021 Hallux rigidus, right foot: Secondary | ICD-10-CM | POA: Diagnosis not present

## 2017-07-23 DIAGNOSIS — M2022 Hallux rigidus, left foot: Secondary | ICD-10-CM | POA: Diagnosis not present

## 2017-07-23 MED ORDER — DICLOFENAC SODIUM 1 % TD GEL: 2.0000 g | Freq: Four times a day (QID) | TRANSDERMAL | 12 refills | Status: DC

## 2017-07-23 MED ORDER — METHYLPREDNISOLONE ACETATE 40 MG/ML IJ SUSP
13.3300 mg | INTRAMUSCULAR | Status: AC | PRN
  Administered 2017-07-23: 13.33 mg via INTRA_ARTICULAR

## 2017-07-23 MED ORDER — LIDOCAINE HCL 1 % IJ SOLN
0.3000 mL | INTRAMUSCULAR | Status: AC | PRN
  Administered 2017-07-23: .3 mL

## 2017-07-23 MED ORDER — BUPIVACAINE HCL 0.5 % IJ SOLN
0.3300 mL | INTRAMUSCULAR | Status: AC | PRN
  Administered 2017-07-23: .33 mL via INTRA_ARTICULAR

## 2017-07-23 MED ORDER — LIDOCAINE HCL 1 % IJ SOLN
0.3000 mL | INTRAMUSCULAR | Status: AC | PRN
Start: 2017-07-23 — End: 2017-07-23
  Administered 2017-07-23: .3 mL

## 2017-07-23 NOTE — Progress Notes (Signed)
Office Visit Note   Patient: Cassidy Jones           Date of Birth: August 19, 1945           MRN: [id number] Visit Date: 07/23/2017              Requested by: Rodrigo Ran, MD [address] [place], [place] [address] PCP: Rodrigo Ran, MD   Assessment & Plan: Visit Diagnoses:  1. Hallux rigidus, right foot   2. Hallux rigidus of left foot     Plan: Bilateral hallux rigidus.  Both great toe MTP joints were injected today.  Patient tolerates well.  Follow-up as needed.  Follow-Up Instructions: Return if symptoms worsen or fail to improve.   Orders:  No orders of the defined types were placed in this encounter.  Meds ordered this encounter  Medications  . diclofenac sodium (VOLTAREN) 1 % GEL    Sig: Apply 2 g topically 4 (four) times daily.    Dispense:  1 Tube    Refill:  12      Procedures: Small Joint Inj: bilateral great MTP on 07/23/2017 5:10 PM Indications: pain Details: 25 G needle Medications (Right): 0.3 mL lidocaine 1 %; 0.33 mL bupivacaine 0.5 %; 13.33 mg methylPREDNISolone acetate 40 MG/ML Medications (Left): 0.3 mL lidocaine 1 %; 0.33 mL bupivacaine 0.5 %; 13.33 mg methylPREDNISolone acetate 40 MG/ML Outcome: tolerated well, no immediate complications Patient was prepped and draped in the usual sterile fashion.       Clinical Data: No additional findings.   Subjective: Chief Complaint  Patient presents with  . Left Foot - Pain  . Right Foot - Pain    Cassidy Jones comes in today for bilateral hallux rigidus injections.  She has had good relief from previous injections.  Denies any changes in medical history.   Review of Systems   Objective: Vital Signs: There were no vitals taken for this visit.  Physical Exam  Ortho Exam Stable exam Specialty Comments:  No specialty comments available.  Imaging: No results found.   PMFS History: Patient Active Problem List   Diagnosis Date Noted  . Pain in left foot 01/06/2016  . Hallux rigidus,  right foot 01/06/2016  . Complex endometrial hyperplasia without atypia 05/06/2015   Past Medical History:  Diagnosis Date  . ADHD (attention deficit hyperactivity disorder)   . Anxiety   . Arthritis    hands, feet, neck  . Complex endometrial hyperplasia without atypia 05/06/2015  . Depression   . History of blood transfusion 2005   at [facility]  . Hyperlipidemia   . Hypertension   . Hypothyroidism   . Renal disorder    stage 3 kidney disease  . Stroke [facility])    years ago, no deficits  . Thyroid disease    thyroid gland removed  . UC (ulcerative colitis) (HCC)    and colon polyps    History reviewed. No pertinent family history.  Past Surgical History:  Procedure Laterality Date  . cataract surgery Bilateral   . colon removed     x 4 surgeries  Hx UC and colon polyps  . COLON SURGERY    . COLONOSCOPY    . EYE SURGERY    . goiters removed     x 2 at age 1, age 14  . HYSTEROSCOPY W/D&C N/A 05/06/2015   Procedure: DILATATION AND CURETTAGE /HYSTEROSCOPY;  Surgeon: Shea Evans, MD;  Location: [facility];  Service: Gynecology;  Laterality: N/A;  . WISDOM TOOTH  EXTRACTION     Social History   Occupational History  . Not on file  Tobacco Use  . Smoking status: Never Smoker  . Smokeless tobacco: Never Used  Substance and Sexual Activity  . Alcohol use: Yes    Alcohol/week: 6.0 oz    Types: 10 Glasses of wine per week    Comment: wine  6 oz wine every night  . Drug use: No  . Sexual activity: Yes    Birth control/protection: Post-menopausal

## 2018-01-23 ENCOUNTER — Ambulatory Visit (INDEPENDENT_AMBULATORY_CARE_PROVIDER_SITE_OTHER): Payer: Managed Care, Other (non HMO) | Admitting: Orthopaedic Surgery

## 2018-03-12 ENCOUNTER — Ambulatory Visit (INDEPENDENT_AMBULATORY_CARE_PROVIDER_SITE_OTHER): Payer: Managed Care, Other (non HMO) | Admitting: Orthopaedic Surgery

## 2018-03-25 ENCOUNTER — Ambulatory Visit (INDEPENDENT_AMBULATORY_CARE_PROVIDER_SITE_OTHER): Payer: Managed Care, Other (non HMO) | Admitting: Orthopaedic Surgery

## 2018-03-25 DIAGNOSIS — M2021 Hallux rigidus, right foot: Secondary | ICD-10-CM | POA: Diagnosis not present

## 2018-03-25 DIAGNOSIS — M2022 Hallux rigidus, left foot: Secondary | ICD-10-CM

## 2018-03-25 MED ORDER — BUPIVACAINE HCL 0.5 % IJ SOLN
0.3300 mL | INTRAMUSCULAR | Status: AC | PRN
  Administered 2018-03-25: .33 mL via INTRA_ARTICULAR

## 2018-03-25 MED ORDER — METHYLPREDNISOLONE ACETATE 40 MG/ML IJ SUSP
13.3300 mg | INTRAMUSCULAR | Status: AC | PRN
  Administered 2018-03-25: 13.33 mg via INTRA_ARTICULAR

## 2018-03-25 MED ORDER — LIDOCAINE HCL 1 % IJ SOLN
0.3000 mL | INTRAMUSCULAR | Status: AC | PRN
  Administered 2018-03-25: .3 mL

## 2018-03-25 NOTE — Progress Notes (Signed)
Office Visit Note   Patient: Cassidy Jones           Date of Birth: Sep 01, 1945           MRN: [id number] Visit Date: 03/25/2018              Requested by: Rodrigo Ran, MD [address] [place], [place] [address] PCP: Rodrigo Ran, MD   Assessment & Plan: Visit Diagnoses:  1. Hallux rigidus of left foot   2. Hallux rigidus, right foot     Plan: We repeated the injections into her great toes today.  Patient tolerates well.  Follow-up as needed.  Follow-Up Instructions: Return if symptoms worsen or fail to improve.   Orders:  No orders of the defined types were placed in this encounter.  No orders of the defined types were placed in this encounter.     Procedures: Small Joint Inj: bilateral great MTP on 03/25/2018 3:45 PM Indications: pain Details: 25 G needle, dorsal approach Medications (Right): 0.3 mL lidocaine 1 %; 0.33 mL bupivacaine 0.5 %; 13.33 mg methylPREDNISolone acetate 40 MG/ML Medications (Left): 0.3 mL lidocaine 1 %; 0.33 mL bupivacaine 0.5 %; 13.33 mg methylPREDNISolone acetate 40 MG/ML Outcome: tolerated well, no immediate complications Patient was prepped and draped in the usual sterile fashion.       Clinical Data: No additional findings.   Subjective: Chief Complaint  Patient presents with  . Right Foot - Pain  . Left Foot - Pain    Cassidy Jones is here for repeat injections for her bilateral hallux rigidus.   Review of Systems   Objective: Vital Signs: There were no vitals taken for this visit.  Physical Exam  Ortho Exam Bilateral great toe exams are stable. Specialty Comments:  No specialty comments available.  Imaging: No results found.   PMFS History: Patient Active Problem List   Diagnosis Date Noted  . Pain in left foot 01/06/2016  . Hallux rigidus, right foot 01/06/2016  . Complex endometrial hyperplasia without atypia 05/06/2015   Past Medical History:  Diagnosis Date  . ADHD (attention deficit hyperactivity  disorder)   . Anxiety   . Arthritis    hands, feet, neck  . Complex endometrial hyperplasia without atypia 05/06/2015  . Depression   . History of blood transfusion 2005   at [facility]  . Hyperlipidemia   . Hypertension   . Hypothyroidism   . Renal disorder    stage 3 kidney disease  . Stroke [facility])    years ago, no deficits  . Thyroid disease    thyroid gland removed  . UC (ulcerative colitis) (HCC)    and colon polyps    No family history on file.  Past Surgical History:  Procedure Laterality Date  . cataract surgery Bilateral   . colon removed     x 4 surgeries  Hx UC and colon polyps  . COLON SURGERY    . COLONOSCOPY    . EYE SURGERY    . goiters removed     x 2 at age 33, age 56  . HYSTEROSCOPY W/D&C N/A 05/06/2015   Procedure: DILATATION AND CURETTAGE /HYSTEROSCOPY;  Surgeon: Shea Evans, MD;  Location: [facility];  Service: Gynecology;  Laterality: N/A;  . WISDOM TOOTH EXTRACTION     Social History   Occupational History  . Not on file  Tobacco Use  . Smoking status: Never Smoker  . Smokeless tobacco: Never Used  Substance and Sexual Activity  . Alcohol use: Yes  Alcohol/week: 10.0 standard drinks    Types: 10 Glasses of wine per week    Comment: wine  6 oz wine every night  . Drug use: No  . Sexual activity: Yes    Birth control/protection: Post-menopausal

## 2019-01-21 ENCOUNTER — Encounter: Payer: Self-pay | Admitting: Orthopaedic Surgery

## 2019-01-21 ENCOUNTER — Other Ambulatory Visit: Payer: Self-pay

## 2019-01-21 ENCOUNTER — Ambulatory Visit (INDEPENDENT_AMBULATORY_CARE_PROVIDER_SITE_OTHER): Payer: Managed Care, Other (non HMO) | Admitting: Orthopaedic Surgery

## 2019-01-21 DIAGNOSIS — M2021 Hallux rigidus, right foot: Secondary | ICD-10-CM

## 2019-01-21 DIAGNOSIS — M2022 Hallux rigidus, left foot: Secondary | ICD-10-CM

## 2019-01-21 MED ORDER — BUPIVACAINE HCL 0.5 % IJ SOLN
0.3300 mL | INTRAMUSCULAR | Status: AC | PRN
  Administered 2019-01-21: .33 mL via INTRA_ARTICULAR

## 2019-01-21 MED ORDER — METHYLPREDNISOLONE ACETATE 40 MG/ML IJ SUSP
13.3300 mg | INTRAMUSCULAR | Status: AC | PRN
  Administered 2019-01-21: 13.33 mg via INTRA_ARTICULAR

## 2019-01-21 MED ORDER — LIDOCAINE HCL 1 % IJ SOLN
0.3000 mL | INTRAMUSCULAR | Status: AC | PRN
  Administered 2019-01-21: .3 mL

## 2019-01-21 NOTE — Progress Notes (Signed)
Office Visit Note   Patient: Cassidy Jones           Date of Birth: 1945-04-04           MRN: [id number] Visit Date: 01/21/2019              Requested by: Rodrigo Ran, MD [address] [place],  [place] [address] PCP: Rodrigo Ran, MD   Assessment & Plan: Visit Diagnoses: No diagnosis found.  Plan: Bilateral hallux rigidus.  Repeated injections today.  Tolerated well.    Follow-Up Instructions: Return if symptoms worsen or fail to improve.   Orders:  Orders Placed This Encounter  Procedures  . Small Joint Inj   No orders of the defined types were placed in this encounter.     Procedures: Small Joint Inj: bilateral great MTP on 01/21/2019 3:41 PM Indications: pain Details: 25 G needle Medications (Right): 0.3 mL lidocaine 1 %; 0.33 mL bupivacaine 0.5 %; 13.33 mg methylPREDNISolone acetate 40 MG/ML Medications (Left): 0.3 mL lidocaine 1 %; 0.33 mL bupivacaine 0.5 %; 13.33 mg methylPREDNISolone acetate 40 MG/ML Outcome: tolerated well, no immediate complications Patient was prepped and draped in the usual sterile fashion.       Clinical Data: No additional findings.   Subjective: Chief Complaint  Patient presents with  . Left Foot - Pain  . Right Foot - Pain    Cassidy Jones returns today for bilateral hallux rigidus.  She is requesting injections.  Last saw her since February for her left hallux rigidus.   Review of Systems   Objective: Vital Signs: There were no vitals taken for this visit.  Physical Exam  Ortho Exam Exam is unchanged.  Specialty Comments:  No specialty comments available.  Imaging: No results found.   PMFS History: Patient Active Problem List   Diagnosis Date Noted  . Pain in left foot 01/06/2016  . Hallux rigidus, right foot 01/06/2016  . Complex endometrial hyperplasia without atypia 05/06/2015   Past Medical History:  Diagnosis Date  . ADHD (attention deficit hyperactivity disorder)   . Anxiety   . Arthritis    hands, feet, neck  . Complex endometrial hyperplasia without atypia 05/06/2015  . Depression   . History of blood transfusion 2005   at [facility]  . Hyperlipidemia   . Hypertension   . Hypothyroidism   . Renal disorder    stage 3 kidney disease  . Stroke [facility])    years ago, no deficits  . Thyroid disease    thyroid gland removed  . UC (ulcerative colitis) (HCC)    and colon polyps    History reviewed. No pertinent family history.  Past Surgical History:  Procedure Laterality Date  . cataract surgery Bilateral   . colon removed     x 4 surgeries  Hx UC and colon polyps  . COLON SURGERY    . COLONOSCOPY    . EYE SURGERY    . goiters removed     x 2 at age 53, age 56  . HYSTEROSCOPY W/D&C N/A 05/06/2015   Procedure: DILATATION AND CURETTAGE /HYSTEROSCOPY;  Surgeon: Shea Evans, MD;  Location: [facility];  Service: Gynecology;  Laterality: N/A;  . WISDOM TOOTH EXTRACTION     Social History   Occupational History  . Not on file  Tobacco Use  . Smoking status: Never Smoker  . Smokeless tobacco: Never Used  Substance and Sexual Activity  . Alcohol use: Yes    Alcohol/week: 10.0 standard drinks  Types: 10 Glasses of wine per week    Comment: wine  6 oz wine every night  . Drug use: No  . Sexual activity: Yes    Birth control/protection: Post-menopausal

## 2019-02-28 ENCOUNTER — Ambulatory Visit: Payer: Self-pay | Attending: Internal Medicine

## 2019-02-28 DIAGNOSIS — Z23 Encounter for immunization: Secondary | ICD-10-CM | POA: Insufficient documentation

## 2019-02-28 NOTE — Progress Notes (Signed)
   Covid-19 Vaccination Clinic  Name:  Cassidy Jones    MRN: [id number] DOB: 1945-10-11  02/28/2019  [name] was observed post Covid-19 immunization for 15 minutes without incidence. She was provided with Vaccine Information Sheet and instruction to access the V-Safe system.   [name] was instructed to call 911 with any severe reactions post vaccine: Marland Kitchen Difficulty breathing  . Swelling of your face and throat  . A fast heartbeat  . A bad rash all over your body  . Dizziness and weakness    Immunizations Administered    Name Date Dose VIS Date Route   Pfizer COVID-19 Vaccine 02/28/2019 11:29 AM 0.3 mL 01/16/2019 Intramuscular   Manufacturer: [employer], [employer]   Lot: [id number]   NDC: [address]-4

## 2019-03-21 ENCOUNTER — Ambulatory Visit: Payer: Self-pay | Attending: Internal Medicine

## 2019-03-21 DIAGNOSIS — Z23 Encounter for immunization: Secondary | ICD-10-CM

## 2019-03-21 NOTE — Progress Notes (Signed)
   Covid-19 Vaccination Clinic  Name:  DARREL BARONI    MRN: [id number] DOB: 07/15/1945  03/21/2019  Ms. Mancini was observed post Covid-19 immunization for  15 minutes without incidence. She was provided with Vaccine Information Sheet and instruction to access the V-Safe system.   Ms. Plair was instructed to call 911 with any severe reactions post vaccine: Marland Kitchen Difficulty breathing  . Swelling of your face and throat  . A fast heartbeat  . A bad rash all over your body  . Dizziness and weakness    Immunizations Administered    Name Date Dose VIS Date Route   Pfizer COVID-19 Vaccine 03/21/2019 10:11 AM 0.3 mL 01/16/2019 Intramuscular   Manufacturer: [employer], [employer]   Lot: [id number]   NDC: [address]-3

## 2019-10-09 ENCOUNTER — Telehealth: Payer: Self-pay | Admitting: Infectious Diseases

## 2019-10-09 NOTE — Telephone Encounter (Signed)
Called to Discuss with patient about Covid symptoms and the use of the monoclonal antibody infusion for those with mild to moderate Covid symptoms and at a high risk of hospitalization.     Pt appears to qualify for this infusion due to co-morbid conditions and/or a member of an at-risk group in accordance with the FDA Emergency Use Authorization.    Unable to reach pt - LVM and sent MyChart

## 2019-10-10 ENCOUNTER — Ambulatory Visit (HOSPITAL_COMMUNITY)
Admission: RE | Admit: 2019-10-10 | Discharge: 2019-10-10 | Disposition: A | Discharge status: Home / Self Care | Payer: Medicare Other | Source: Ambulatory Visit | Attending: Pulmonary Disease | Admitting: Pulmonary Disease

## 2019-10-10 ENCOUNTER — Other Ambulatory Visit: Payer: Self-pay | Admitting: Nurse Practitioner

## 2019-10-10 DIAGNOSIS — I10 Essential (primary) hypertension: Secondary | ICD-10-CM

## 2019-10-10 DIAGNOSIS — Z23 Encounter for immunization: Secondary | ICD-10-CM | POA: Diagnosis not present

## 2019-10-10 DIAGNOSIS — U071 COVID-19: Secondary | ICD-10-CM | POA: Diagnosis present

## 2019-10-10 MED ORDER — SODIUM CHLORIDE 0.9 % IV SOLN
1200.0000 mg | Freq: Once | INTRAVENOUS | Status: AC
  Administered 2019-10-10: 1200 mg via INTRAVENOUS
  Filled 2019-10-10: qty 10

## 2019-10-10 MED ORDER — FAMOTIDINE IN NACL 20-0.9 MG/50ML-% IV SOLN: 20.0000 mg | Freq: Once | INTRAVENOUS | Status: DC | PRN

## 2019-10-10 MED ORDER — ALBUTEROL SULFATE HFA 108 (90 BASE) MCG/ACT IN AERS: 2.0000 | INHALATION_SPRAY | Freq: Once | RESPIRATORY_TRACT | Status: DC | PRN

## 2019-10-10 MED ORDER — DIPHENHYDRAMINE HCL 50 MG/ML IJ SOLN: 50.0000 mg | Freq: Once | INTRAMUSCULAR | Status: DC | PRN

## 2019-10-10 MED ORDER — SODIUM CHLORIDE 0.9 % IV SOLN: INTRAVENOUS | Status: DC | PRN

## 2019-10-10 MED ORDER — METHYLPREDNISOLONE SODIUM SUCC 125 MG IJ SOLR: 125.0000 mg | Freq: Once | INTRAMUSCULAR | Status: DC | PRN

## 2019-10-10 MED ORDER — EPINEPHRINE 0.3 MG/0.3ML IJ SOAJ: 0.3000 mg | Freq: Once | INTRAMUSCULAR | Status: DC | PRN

## 2019-10-10 NOTE — Discharge Instructions (Signed)

## 2019-10-10 NOTE — Progress Notes (Signed)
I connected by phone with Cassidy Jones on 10/10/2019 at 10:09 AM to discuss the potential use of a new treatment for mild to moderate COVID-19 viral infection in non-hospitalized patients.  This patient is a 74 y.o. female that meets the FDA criteria for Emergency Use Authorization of COVID monoclonal antibody casirivimab/imdevimab.  Has a (+) direct SARS-CoV-2 viral test result  Has mild or moderate COVID-19   Is NOT hospitalized due to COVID-19  Is within 10 days of symptom onset (10/01/19)   Has at least one of the high risk factor(s) for progression to severe COVID-19 and/or hospitalization as defined in EUA.  Specific high risk criteria : Older age (>/= 76 yo)   I have spoken and communicated the following to the patient or parent/caregiver regarding COVID monoclonal antibody treatment:  1. FDA has authorized the emergency use for the treatment of mild to moderate COVID-19 in adults and pediatric patients with positive results of direct SARS-CoV-2 viral testing who are 30 years of age and older weighing at least 40 kg, and who are at high risk for progressing to severe COVID-19 and/or hospitalization.  2. The significant known and potential risks and benefits of COVID monoclonal antibody, and the extent to which such potential risks and benefits are unknown.  3. Information on available alternative treatments and the risks and benefits of those alternatives, including clinical trials.  4. Patients treated with COVID monoclonal antibody should continue to self-isolate and use infection control measures (e.g., wear mask, isolate, social distance, avoid sharing personal items, clean and disinfect "high touch" surfaces, and frequent handwashing) according to CDC guidelines.   5. The patient or parent/caregiver has the option to accept or refuse COVID monoclonal antibody treatment.  After reviewing this information with the patient, The patient agreed to proceed with receiving  casirivimab\imdevimab infusion and will be provided a copy of the Fact sheet prior to receiving the infusion. [name] 10/10/2019 10:09 AM

## 2019-10-10 NOTE — Progress Notes (Signed)
  Diagnosis: COVID-19  Physician: Mannam MD  Procedure: Covid Infusion Clinic Med: casirivimab\imdevimab infusion - Provided patient with casirivimab\imdevimab fact sheet for patients, parents and caregivers prior to infusion.  Complications: No immediate complications noted.  Discharge: Discharged home   Cassidy Jones D [name] 10/10/2019   

## 2020-01-12 ENCOUNTER — Ambulatory Visit: Payer: Self-pay

## 2020-01-12 ENCOUNTER — Ambulatory Visit (INDEPENDENT_AMBULATORY_CARE_PROVIDER_SITE_OTHER): Payer: Medicare Other | Admitting: Orthopaedic Surgery

## 2020-01-12 ENCOUNTER — Encounter: Payer: Self-pay | Admitting: Orthopaedic Surgery

## 2020-01-12 ENCOUNTER — Other Ambulatory Visit: Payer: Self-pay

## 2020-01-12 DIAGNOSIS — M2021 Hallux rigidus, right foot: Secondary | ICD-10-CM

## 2020-01-12 DIAGNOSIS — M2022 Hallux rigidus, left foot: Secondary | ICD-10-CM | POA: Diagnosis not present

## 2020-01-12 MED ORDER — METHYLPREDNISOLONE ACETATE 40 MG/ML IJ SUSP
13.3300 mg | INTRAMUSCULAR | Status: AC | PRN
  Administered 2020-01-12: 13.33 mg via INTRA_ARTICULAR

## 2020-01-12 MED ORDER — BUPIVACAINE HCL 0.5 % IJ SOLN
0.3300 mL | INTRAMUSCULAR | Status: AC | PRN
  Administered 2020-01-12: .33 mL via INTRA_ARTICULAR

## 2020-01-12 MED ORDER — LIDOCAINE HCL 1 % IJ SOLN
0.3000 mL | INTRAMUSCULAR | Status: AC | PRN
  Administered 2020-01-12: .3 mL

## 2020-01-12 NOTE — Progress Notes (Signed)
Office Visit Note   Patient: Cassidy Jones           Date of Birth: 02-12-1945           MRN: [id number] Visit Date: 01/12/2020              Requested by: Cassidy Ran, MD [address] [place],  [place] [address] PCP: Cassidy Ran, MD   Assessment & Plan: Visit Diagnoses:  1. Hallux rigidus of left foot   2. Hallux rigidus, right foot     Plan: I impression is bilateral hallux rigidus.  Injections repeated today into the MTP joints of the hallux.  Follow-up as needed.  Follow-Up Instructions: No follow-ups on file.   Orders:  No orders of the defined types were placed in this encounter.  No orders of the defined types were placed in this encounter.     Procedures: Small Joint Inj: bilateral great MTP on 01/12/2020 4:22 PM Medications (Right): 0.3 mL lidocaine 1 %; 0.33 mL bupivacaine 0.5 %; 13.33 mg methylPREDNISolone acetate 40 MG/ML Medications (Left): 0.3 mL lidocaine 1 %; 0.33 mL bupivacaine 0.5 %; 13.33 mg methylPREDNISolone acetate 40 MG/ML Outcome: tolerated well, no immediate complications      Clinical Data: No additional findings.   Subjective: Chief Complaint  Patient presents with  . Right Foot - Pain  . Left Foot - Pain    Cassidy Jones returns today for follow-up of bilateral hallux rigidus.  She would like to have injections repeated.  She continues to get significant relief from these injections.   Review of Systems   Objective: Vital Signs: There were no vitals taken for this visit.  Physical Exam  Ortho Exam Exam is stable. Specialty Comments:  No specialty comments available.  Imaging: No results found.   PMFS History: Patient Active Problem List   Diagnosis Date Noted  . Hallux rigidus of left foot 01/12/2020  . Pain in left foot 01/06/2016  . Hallux rigidus, right foot 01/06/2016  . Complex endometrial hyperplasia without atypia 05/06/2015   Past Medical History:  Diagnosis Date  . ADHD (attention deficit  hyperactivity disorder)   . Anxiety   . Arthritis    hands, feet, neck  . Complex endometrial hyperplasia without atypia 05/06/2015  . Depression   . History of blood transfusion 2005   at [facility]  . Hyperlipidemia   . Hypertension   . Hypothyroidism   . Renal disorder    stage 3 kidney disease  . Stroke [facility])    years ago, no deficits  . Thyroid disease    thyroid gland removed  . UC (ulcerative colitis) (HCC)    and colon polyps    History reviewed. No pertinent family history.  Past Surgical History:  Procedure Laterality Date  . cataract surgery Bilateral   . colon removed     x 4 surgeries  Hx UC and colon polyps  . COLON SURGERY    . COLONOSCOPY    . EYE SURGERY    . goiters removed     x 2 at age 83, age 60  . HYSTEROSCOPY WITH D & C N/A 05/06/2015   Procedure: DILATATION AND CURETTAGE /HYSTEROSCOPY;  Surgeon: Shea Evans, MD;  Location: [facility];  Service: Gynecology;  Laterality: N/A;  . WISDOM TOOTH EXTRACTION     Social History   Occupational History  . Not on file  Tobacco Use  . Smoking status: Never Smoker  . Smokeless tobacco: Never Used  Substance and  Sexual Activity  . Alcohol use: Yes    Alcohol/week: 10.0 standard drinks    Types: 10 Glasses of wine per week    Comment: wine  6 oz wine every night  . Drug use: No  . Sexual activity: Yes    Birth control/protection: Post-menopausal

## 2020-05-04 ENCOUNTER — Other Ambulatory Visit (HOSPITAL_COMMUNITY): Payer: Self-pay | Admitting: *Deleted

## 2020-05-05 ENCOUNTER — Ambulatory Visit (HOSPITAL_COMMUNITY)
Admission: RE | Admit: 2020-05-05 | Discharge: 2020-05-05 | Disposition: A | Discharge status: Home / Self Care | Payer: Medicare Other | Source: Ambulatory Visit | Attending: Internal Medicine | Admitting: Internal Medicine

## 2020-05-05 ENCOUNTER — Other Ambulatory Visit: Payer: Self-pay

## 2020-05-05 DIAGNOSIS — M810 Age-related osteoporosis without current pathological fracture: Secondary | ICD-10-CM | POA: Diagnosis present

## 2020-05-05 MED ORDER — DENOSUMAB 60 MG/ML ~~LOC~~ SOSY
60.0000 mg | PREFILLED_SYRINGE | Freq: Once | SUBCUTANEOUS | Status: AC
  Administered 2020-05-05: 60 mg via SUBCUTANEOUS

## 2020-05-05 MED ORDER — DENOSUMAB 60 MG/ML ~~LOC~~ SOSY
PREFILLED_SYRINGE | SUBCUTANEOUS | Status: AC
  Filled 2020-05-05: qty 1

## 2020-05-05 NOTE — Discharge Instructions (Signed)
Denosumab injection What is this medicine? DENOSUMAB (den oh sue mab) slows bone breakdown. Prolia is used to treat osteoporosis in women after menopause and in men, and in people who are taking corticosteroids for 6 months or more. Xgeva is used to treat a high calcium level due to cancer and to prevent bone fractures and other bone problems caused by multiple myeloma or cancer bone metastases. Xgeva is also used to treat giant cell tumor of the bone. This medicine may be used for other purposes; ask your health care provider or pharmacist if you have questions. COMMON BRAND NAME(S): Prolia, XGEVA What should I tell my health care provider before I take this medicine? They need to know if you have any of these conditions:  dental disease  having surgery or tooth extraction  infection  kidney disease  low levels of calcium or Vitamin D in the blood  malnutrition  on hemodialysis  skin conditions or sensitivity  thyroid or parathyroid disease  an unusual reaction to denosumab, other medicines, foods, dyes, or preservatives  pregnant or trying to get pregnant  breast-feeding How should I use this medicine? This medicine is for injection under the skin. It is given by a health care professional in a hospital or clinic setting. A special MedGuide will be given to you before each treatment. Be sure to read this information carefully each time. For Prolia, talk to your pediatrician regarding the use of this medicine in children. Special care may be needed. For Xgeva, talk to your pediatrician regarding the use of this medicine in children. While this drug may be prescribed for children as young as 13 years for selected conditions, precautions do apply. Overdosage: If you think you have taken too much of this medicine contact a poison control center or emergency room at once. NOTE: This medicine is only for you. Do not share this medicine with others. What if I miss a dose? It is  important not to miss your dose. Call your doctor or health care professional if you are unable to keep an appointment. What may interact with this medicine? Do not take this medicine with any of the following medications:  other medicines containing denosumab This medicine may also interact with the following medications:  medicines that lower your chance of fighting infection  steroid medicines like prednisone or cortisone This list may not describe all possible interactions. Give your health care provider a list of all the medicines, herbs, non-prescription drugs, or dietary supplements you use. Also tell them if you smoke, drink alcohol, or use illegal drugs. Some items may interact with your medicine. What should I watch for while using this medicine? Visit your doctor or health care professional for regular checks on your progress. Your doctor or health care professional may order blood tests and other tests to see how you are doing. Call your doctor or health care professional for advice if you get a fever, chills or sore throat, or other symptoms of a cold or flu. Do not treat yourself. This drug may decrease your body's ability to fight infection. Try to avoid being around people who are sick. You should make sure you get enough calcium and vitamin D while you are taking this medicine, unless your doctor tells you not to. Discuss the foods you eat and the vitamins you take with your health care professional. See your dentist regularly. Brush and floss your teeth as directed. Before you have any dental work done, tell your dentist you are   receiving this medicine. Do not become pregnant while taking this medicine or for 5 months after stopping it. Talk with your doctor or health care professional about your birth control options while taking this medicine. Women should inform their doctor if they wish to become pregnant or think they might be pregnant. There is a potential for serious side  effects to an unborn child. Talk to your health care professional or pharmacist for more information. What side effects may I notice from receiving this medicine? Side effects that you should report to your doctor or health care professional as soon as possible:  allergic reactions like skin rash, itching or hives, swelling of the face, lips, or tongue  bone pain  breathing problems  dizziness  jaw pain, especially after dental work  redness, blistering, peeling of the skin  signs and symptoms of infection like fever or chills; cough; sore throat; pain or trouble passing urine  signs of low calcium like fast heartbeat, muscle cramps or muscle pain; pain, tingling, numbness in the hands or feet; seizures  unusual bleeding or bruising  unusually weak or tired Side effects that usually do not require medical attention (report to your doctor or health care professional if they continue or are bothersome):  constipation  diarrhea  headache  joint pain  loss of appetite  muscle pain  runny nose  tiredness  upset stomach This list may not describe all possible side effects. Call your doctor for medical advice about side effects. You may report side effects to FDA at 1-800-FDA-1088. Where should I keep my medicine? This medicine is only given in a clinic, doctor's office, or other health care setting and will not be stored at home. NOTE: This sheet is a summary. It may not cover all possible information. If you have questions about this medicine, talk to your doctor, pharmacist, or health care provider.  2021 Elsevier/Gold Standard (2017-05-31 16:10:44)

## 2020-05-11 ENCOUNTER — Encounter (HOSPITAL_COMMUNITY): Payer: Medicare Other

## 2020-11-04 ENCOUNTER — Other Ambulatory Visit (HOSPITAL_COMMUNITY): Payer: Self-pay | Admitting: *Deleted

## 2020-11-07 ENCOUNTER — Encounter (HOSPITAL_COMMUNITY)
Admission: RE | Admit: 2020-11-07 | Discharge: 2020-11-07 | Disposition: A | Discharge status: Home / Self Care | Payer: Medicare Other | Source: Ambulatory Visit | Attending: Internal Medicine | Admitting: Internal Medicine

## 2020-11-07 ENCOUNTER — Other Ambulatory Visit: Payer: Self-pay

## 2020-11-07 DIAGNOSIS — M810 Age-related osteoporosis without current pathological fracture: Secondary | ICD-10-CM | POA: Insufficient documentation

## 2020-11-07 MED ORDER — DENOSUMAB 60 MG/ML ~~LOC~~ SOSY
PREFILLED_SYRINGE | SUBCUTANEOUS | Status: AC
  Administered 2020-11-07: 60 mg via SUBCUTANEOUS
  Filled 2020-11-07: qty 1

## 2020-11-07 MED ORDER — DENOSUMAB 60 MG/ML ~~LOC~~ SOSY: 60.0000 mg | PREFILLED_SYRINGE | Freq: Once | SUBCUTANEOUS | Status: AC

## 2020-12-06 ENCOUNTER — Ambulatory Visit: Payer: Medicare Other | Admitting: Orthopaedic Surgery

## 2020-12-20 ENCOUNTER — Ambulatory Visit: Payer: Medicare Other | Admitting: Orthopaedic Surgery

## 2021-01-03 ENCOUNTER — Encounter: Payer: Self-pay | Admitting: Orthopaedic Surgery

## 2021-01-03 ENCOUNTER — Other Ambulatory Visit: Payer: Self-pay

## 2021-01-03 ENCOUNTER — Ambulatory Visit (INDEPENDENT_AMBULATORY_CARE_PROVIDER_SITE_OTHER): Payer: Medicare Other | Admitting: Orthopaedic Surgery

## 2021-01-03 DIAGNOSIS — M2021 Hallux rigidus, right foot: Secondary | ICD-10-CM | POA: Diagnosis not present

## 2021-01-03 DIAGNOSIS — M2022 Hallux rigidus, left foot: Secondary | ICD-10-CM | POA: Diagnosis not present

## 2021-01-03 MED ORDER — METHYLPREDNISOLONE ACETATE 40 MG/ML IJ SUSP
20.0000 mg | INTRAMUSCULAR | Status: AC | PRN
  Administered 2021-01-03: 20 mg via INTRA_ARTICULAR

## 2021-01-03 NOTE — Progress Notes (Signed)
Office Visit Note   Patient: JEANETT ANTONOPOULOS           Date of Birth: 1945/09/16           MRN: [id number] Visit Date: 01/03/2021              Requested by: Rodrigo Ran, MD [address] [place],  [place] [address] PCP: Rodrigo Ran, MD   Assessment & Plan: Visit Diagnoses:  1. Hallux rigidus of left foot   2. Hallux rigidus, right foot     Plan: Meryle returns today for bilateral great toe MTP joint injections.  Denies any changes to medical history.  Last injections were about a year ago which gave her really good relief.  She tolerated the injections well today.  Follow-up as needed.  Follow-Up Instructions: No follow-ups on file.   Orders:  No orders of the defined types were placed in this encounter.  No orders of the defined types were placed in this encounter.     Procedures: Small Joint Inj: bilateral great MTP on 01/03/2021 3:31 PM Details: 25 G needle, dorsal approach Medications (Right): 20 mg methylPREDNISolone acetate 40 MG/ML Medications (Left): 20 mg methylPREDNISolone acetate 40 MG/ML     Clinical Data: No additional findings.   Subjective: Chief Complaint  Patient presents with   Right Foot - Pain   Left Foot - Pain    HPI  Review of Systems   Objective: Vital Signs: There were no vitals taken for this visit.  Physical Exam  Ortho Exam  Specialty Comments:  No specialty comments available.  Imaging: No results found.   PMFS History: Patient Active Problem List   Diagnosis Date Noted   Hallux rigidus of left foot 01/12/2020   Pain in left foot 01/06/2016   Hallux rigidus, right foot 01/06/2016   Complex endometrial hyperplasia without atypia 05/06/2015   Past Medical History:  Diagnosis Date   ADHD (attention deficit hyperactivity disorder)    Anxiety    Arthritis    hands, feet, neck   Complex endometrial hyperplasia without atypia 05/06/2015   Depression    History of blood transfusion 2005   at [facility]    Hyperlipidemia    Hypertension    Hypothyroidism    Renal disorder    stage 3 kidney disease   Stroke [facility])    years ago, no deficits   Thyroid disease    thyroid gland removed   UC (ulcerative colitis) ([facility])    and colon polyps    History reviewed. No pertinent family history.  Past Surgical History:  Procedure Laterality Date   cataract surgery Bilateral    colon removed     x 4 surgeries  Hx UC and colon polyps   COLON SURGERY     COLONOSCOPY     EYE SURGERY     goiters removed     x 2 at age 79, age 57   HYSTEROSCOPY WITH D & C N/A 05/06/2015   Procedure: DILATATION AND CURETTAGE /HYSTEROSCOPY;  Surgeon: Shea Evans, MD;  Location: [facility];  Service: Gynecology;  Laterality: N/A;   WISDOM TOOTH EXTRACTION     Social History   Occupational History   Not on file  Tobacco Use   Smoking status: Never   Smokeless tobacco: Never  Substance and Sexual Activity   Alcohol use: Yes    Alcohol/week: 10.0 standard drinks    Types: 10 Glasses of wine per week    Comment: wine  6  oz wine every night   Drug use: No   Sexual activity: Yes    Birth control/protection: Post-menopausal

## 2021-03-21 ENCOUNTER — Other Ambulatory Visit: Payer: Self-pay

## 2021-03-21 ENCOUNTER — Ambulatory Visit (INDEPENDENT_AMBULATORY_CARE_PROVIDER_SITE_OTHER): Payer: Medicare Other

## 2021-03-21 ENCOUNTER — Ambulatory Visit (INDEPENDENT_AMBULATORY_CARE_PROVIDER_SITE_OTHER): Payer: Medicare Other | Admitting: Orthopaedic Surgery

## 2021-03-21 ENCOUNTER — Encounter: Payer: Self-pay | Admitting: Orthopaedic Surgery

## 2021-03-21 DIAGNOSIS — M25561 Pain in right knee: Secondary | ICD-10-CM

## 2021-03-21 DIAGNOSIS — G8929 Other chronic pain: Secondary | ICD-10-CM | POA: Diagnosis not present

## 2021-03-21 MED ORDER — LIDOCAINE HCL 1 % IJ SOLN
2.0000 mL | INTRAMUSCULAR | Status: AC | PRN
  Administered 2021-03-21: 2 mL

## 2021-03-21 MED ORDER — METHYLPREDNISOLONE ACETATE 40 MG/ML IJ SUSP
40.0000 mg | INTRAMUSCULAR | Status: AC | PRN
  Administered 2021-03-21: 40 mg via INTRA_ARTICULAR

## 2021-03-21 MED ORDER — OXYCODONE-ACETAMINOPHEN 5-325 MG PO TABS: 1.0000 | ORAL_TABLET | Freq: Every day | ORAL | 0 refills | Status: AC | PRN

## 2021-03-21 MED ORDER — BUPIVACAINE HCL 0.5 % IJ SOLN
2.0000 mL | INTRAMUSCULAR | Status: AC | PRN
  Administered 2021-03-21: 2 mL via INTRA_ARTICULAR

## 2021-03-21 NOTE — Progress Notes (Addendum)
Office Visit Note   Patient: Cassidy Jones           Date of Birth: 05/15/1945           MRN: [id number] Visit Date: 03/21/2021              Requested by: Crist Infante, MD [address] [place],  [place] [address] PCP: Crist Infante, MD   Assessment & Plan: Visit Diagnoses:  1. Chronic pain of right knee     Plan: Dameshia comes in today for severe right knee pain.  Denies any injuries.  She has been unable to weight-bear or walk any significant distances.  She has been using heat and ice and a knee sleeve and Tylenol and use of all Percocets which helped.  Denies any mechanical symptoms.  Examination right knee shows no joint effusion.  She has a slight flexion contracture.  Slight tenderness to the medial side of the knee.  Collaterals and cruciates are stable.  Patellofemoral crepitus with range of motion.  Impression is right knee pain.  She does have significant medial compartment joint space narrowing.  Not really reporting mechanical symptoms.  Based on findings I recommend trying a cortisone injection today.  I recommended an OA reaction brace by [facility] but she will think about this.  If she does not get any relief from the injection we may have to consider an MRI.  Will send in 10 tablets of percocet for severe pain.  Follow-Up Instructions: No follow-ups on file.   Orders:  Orders Placed This Encounter  Procedures   Large Joint Inj: R knee   XR KNEE 3 VIEW RIGHT   Meds ordered this encounter  Medications   bupivacaine (MARCAINE) 0.5 % (with pres) injection 2 mL   lidocaine (XYLOCAINE) 1 % (with pres) injection 2 mL   methylPREDNISolone acetate (DEPO-MEDROL) injection 40 mg   oxyCODONE-acetaminophen (PERCOCET) 5-325 MG tablet    Sig: Take 1 tablet by mouth daily as needed for severe pain.    Dispense:  10 tablet    Refill:  0      Procedures: Large Joint Inj: R knee on 03/21/2021 4:02 PM Indications: pain Details: 22 G needle  Arthrogram: No  Medications: 40  mg methylPREDNISolone acetate 40 MG/ML; 2 mL lidocaine 1 %; 2 mL bupivacaine 0.5 % Consent was given by the patient. Patient was prepped and draped in the usual sterile fashion.      Clinical Data: No additional findings.   Subjective: Chief Complaint  Patient presents with   Right Knee - Pain    HPI  Review of Systems   Objective: Vital Signs: There were no vitals taken for this visit.  Physical Exam  Ortho Exam  Specialty Comments:  No specialty comments available.  Imaging: XR KNEE 3 VIEW RIGHT  Result Date: 03/21/2021 Advanced medial compartment joint space narrowing with spurring consistent with degenerative joint disease.    PMFS History: Patient Active Problem List   Diagnosis Date Noted   Hallux rigidus of left foot 01/12/2020   Pain in left foot 01/06/2016   Hallux rigidus, right foot 01/06/2016   Complex endometrial hyperplasia without atypia 05/06/2015   Past Medical History:  Diagnosis Date   ADHD (attention deficit hyperactivity disorder)    Anxiety    Arthritis    hands, feet, neck   Complex endometrial hyperplasia without atypia 05/06/2015   Depression    History of blood transfusion 2005   at [facility]   Hyperlipidemia  Hypertension    Hypothyroidism    Renal disorder    stage 3 kidney disease   Stroke [facility])    years ago, no deficits   Thyroid disease    thyroid gland removed   UC (ulcerative colitis) ([place])    and colon polyps    History reviewed. No pertinent family history.  Past Surgical History:  Procedure Laterality Date   cataract surgery Bilateral    colon removed     x 4 surgeries  Hx UC and colon polyps   COLON SURGERY     COLONOSCOPY     EYE SURGERY     goiters removed     x 2 at age 36, age 53   HYSTEROSCOPY WITH D & C N/A 05/06/2015   Procedure: DILATATION AND CURETTAGE /HYSTEROSCOPY;  Surgeon: Azucena Fallen, MD;  Location: [place] [facility];  Service: Gynecology;  Laterality: N/A;   WISDOM TOOTH EXTRACTION     Social  History   Occupational History   Not on file  Tobacco Use   Smoking status: Never   Smokeless tobacco: Never  Substance and Sexual Activity   Alcohol use: Yes    Alcohol/week: 10.0 standard drinks    Types: 10 Glasses of wine per week    Comment: wine  6 oz wine every night   Drug use: No   Sexual activity: Yes    Birth control/protection: Post-menopausal

## 2021-03-21 NOTE — Addendum Note (Signed)
Addended by: Mayra Reel on: 03/21/2021 04:10 PM   Modules accepted: Orders

## 2021-03-22 ENCOUNTER — Telehealth: Payer: Self-pay | Admitting: Orthopaedic Surgery

## 2021-03-22 NOTE — Telephone Encounter (Signed)
Pt called to ask question about medication that was just sent in.   CB [phone]

## 2021-03-22 NOTE — Telephone Encounter (Signed)
Pt called and stated she figured out medication problem and does not need a call back.

## 2021-03-28 ENCOUNTER — Ambulatory Visit: Payer: Medicare Other | Admitting: Orthopaedic Surgery

## 2021-05-23 ENCOUNTER — Other Ambulatory Visit (HOSPITAL_COMMUNITY): Payer: Self-pay | Admitting: *Deleted

## 2021-05-24 ENCOUNTER — Ambulatory Visit (HOSPITAL_COMMUNITY)
Admission: RE | Admit: 2021-05-24 | Discharge: 2021-05-24 | Disposition: A | Discharge status: Home / Self Care | Payer: Medicare Other | Source: Ambulatory Visit | Attending: Internal Medicine | Admitting: Internal Medicine

## 2021-05-24 DIAGNOSIS — M810 Age-related osteoporosis without current pathological fracture: Secondary | ICD-10-CM | POA: Diagnosis present

## 2021-05-24 MED ORDER — DENOSUMAB 60 MG/ML ~~LOC~~ SOSY
60.0000 mg | PREFILLED_SYRINGE | Freq: Once | SUBCUTANEOUS | Status: AC
  Administered 2021-05-24: 60 mg via SUBCUTANEOUS

## 2021-05-24 MED ORDER — DENOSUMAB 60 MG/ML ~~LOC~~ SOSY
PREFILLED_SYRINGE | SUBCUTANEOUS | Status: AC
  Filled 2021-05-24: qty 1

## 2021-06-11 NOTE — Progress Notes (Signed)
?Cardiology Office Note:   ? ?Date:  06/12/2021  ? ?ID:  Cassidy Jones, DOB Apr 04, 1945, MRN [id number] ? ?PCP:  Crist Infante, MD  ?Cardiologist:  Sinclair Grooms, MD  ? ?Referring MD: Crist Infante, MD  ? ?Chief Complaint  ?Patient presents with  ? Coronary Artery Disease  ? Hypertension  ? ? ?History of Present Illness:   ? ?Cassidy Jones is a 76 y.o. female with a hx of fast heart rate and weakness. She has h/o cerebellar infarct and Anxiety/depression. ? ?The patient has a history of ulcerative colitis and has had multiple abdominal operations related.  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   ? ?She has also a history of silent cerebellar stroke.   ? ?She is here now because of fast heart rates above 100 bpm and at times a sensation that she feels nervous.  She denies palpitations. ? ?History of 2 prior syncopal episodes, one 3 years ago in the next 3 months ago.  Both occurred upon arising from bed with intentions of going to the bathroom. ? ?She denies chest pain, orthopnea, PND, lower extremity swelling.  She does have to use opiates to slow transit time and prevent multiple loose bowel movements per day. ? ?Most recently she has noted some elevated blood pressures. ? ?Past Medical History:  ?Diagnosis Date  ? ADHD (attention deficit hyperactivity disorder)   ? Anxiety   ? Arthritis   ? hands, feet, neck  ? 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 Depression   ? History of blood transfusion 2005  ? at [facility]  ? Hyperlipidemia   ? Hypertension   ? Hypothyroidism   ? Renal disorder   ? stage 3 kidney disease  ? Stroke [facility])   ? years ago, no deficits  ? Thyroid disease   ? thyroid gland removed  ? UC (ulcerative colitis) ([place])   ? and colon polyps  ? ? ?Past Surgical History:  ?Procedure Laterality Date  ? cataract surgery Bilateral   ? colon removed    ? x 4 surgeries  Hx UC and colon polyps  ? COLON SURGERY    ? COLONOSCOPY    ? EYE SURGERY    ? goiters removed    ? x 2 at  age 29, age 89  ? HYSTEROSCOPY WITH D & C N/A 05/06/2015  ? Procedure: DILATATION AND CURETTAGE /HYSTEROSCOPY;  Surgeon: Azucena Fallen, MD;  Location: [facility];  Service: Gynecology;  Laterality: N/A;  ? WISDOM TOOTH EXTRACTION    ? ? ?Current Medications: ?Current Meds  ?Medication Sig  ? acetaminophen (TYLENOL) 500 MG tablet Take 500 mg by mouth as needed.  ? aspirin 81 MG chewable tablet Chew 81 mg by mouth daily.  ? B Complex Vitamins (VITAMIN B-COMPLEX) TABS Take 1 tablet by mouth daily.  ? calcitonin, salmon, (MIACALCIN/FORTICAL) 200 UNIT/ACT nasal spray Place 1 spray into alternate nostrils daily.   ? calcitRIOL (ROCALTROL) 0.25 MCG capsule Take 0.25-0.5 mcg by mouth See admin instructions. Patient alternates between 0.59mcg and 0.73mcg daily  ? calcium carbonate 200 MG capsule Take 600 mg by mouth daily.   ? ciprofloxacin (CIPRO) 500 MG tablet Take 500 mg by mouth daily.   ? diclofenac sodium (VOLTAREN) 1 % GEL Apply 2 g topically 4 (four) times daily.  ? diclofenac sodium (VOLTAREN) 1 % GEL Apply 2 g topically 4 (four) times daily.  ? estradiol (CLIMARA - DOSED IN MG/24 HR) 0.025 mg/24hr patch Place  1 patch onto the skin once a week.   ? felodipine (PLENDIL) 5 MG 24 hr tablet Take 2.5 tablets by mouth at bedtime.   ? LORazepam (ATIVAN) 1 MG tablet Take 0.25-1 tablets by mouth 3 (three) times daily. 1 tab mid-morning, 1/2 tab in the afternoon and 1/4 tab at bedtime as needed for anxiety  ? MELATONIN PO Take 3 mg by mouth daily.  ? metroNIDAZOLE (FLAGYL PO) Take by mouth.  ? metroNIDAZOLE (METROGEL) 0.75 % vaginal gel Place 1 Applicatorful vaginally See admin instructions. 5 times per week  ? Multiple Vitamin (MULTIVITAMIN WITH MINERALS) TABS tablet Take 1 tablet by mouth daily.  ? Multiple Vitamins-Minerals (PRESERVISION AREDS 2 PO) Take by mouth 2 (two) times daily.  ? Multiple Vitamins-Minerals (ZINC PO) Take 5 mg by mouth daily.  ? Omega-3 Fatty Acids (FISH OIL OMEGA-3 PO) Take 300 mg by mouth daily.  ? Opium  Tincture, Paregoric, 2 MG/5ML TINC Take 5 mLs by mouth 3 (three) times daily. pain  ? oxyCODONE-acetaminophen (PERCOCET) 5-325 MG tablet Take 1 tablet by mouth daily as needed for severe pain.  ? potassium chloride SA (K-DUR,KLOR-CON) 20 MEQ tablet Take 1 tablet by mouth every evening.   ? pravastatin (PRAVACHOL) 20 MG tablet Take 1 tablet by mouth every evening.   ? PREMARIN vaginal cream Place 1 Applicatorful vaginally 2 (two) times a week.  ? PRESCRIPTION MEDICATION Place 1 suppository rectally See admin instructions. Metronidazole 125mg  Suppository: 1 suppository per rectum 5 times per week.  ? Sertraline HCl (ZOLOFT PO) Take 12.5 mg by mouth daily.  ? SYNTHROID 75 MCG tablet Take 1 tablet by mouth daily.  ? terconazole (TERAZOL 7) 0.4 % vaginal cream Place 1 applicator vaginally once a week.   ? Turmeric Curcumin 500 MG CAPS Take 1 capsule by mouth daily.  ? vitamin B-12 (CYANOCOBALAMIN) 1000 MCG tablet Take 1,000 mcg by mouth daily.  ? VYVANSE 40 MG capsule Take 10 mg by mouth daily.   ? zolpidem (AMBIEN) 5 MG tablet Take 1 tablet by mouth at bedtime as needed. sleep  ?  ? ?Allergies:   Mercaptopurine and Metronidazole  ? ?Social History  ? ?Socioeconomic History  ? Marital status: Married  ?  Spouse name: Not on file  ? Number of children: Not on file  ? Years of education: Not on file  ? Highest education level: Not on file  ?Occupational History  ? Not on file  ?Tobacco Use  ? Smoking status: Never  ? Smokeless tobacco: Never  ?Substance and Sexual Activity  ? Alcohol use: Yes  ?  Alcohol/week: 10.0 standard drinks  ?  Types: 10 Glasses of wine per week  ?  Comment: wine  6 oz wine every night  ? Drug use: No  ? Sexual activity: Yes  ?  Birth control/protection: Post-menopausal  ?Other Topics Concern  ? Not on file  ?Social History Narrative  ? Not on file  ? ?Social Determinants of Health  ? ?Financial Resource Strain: Not on file  ?Food Insecurity: Not on file  ?Transportation Needs: Not on file   ?Physical Activity: Not on file  ?Stress: Not on file  ?Social Connections: Not on file  ?  ? ?Family History: ?The patient's family history is not on file. ? ?ROS:   ?Please see the history of present illness.    ?GI related problems from ulcerative colitis.  Has had multiple falls in the past.  History of syncope x2.  All other systems reviewed  and are negative. ? ?EKGs/Labs/Other Studies Reviewed:   ? ?The following studies were reviewed today: ?Multiple vascular screening studies and laboratory data from her primary physician were reviewed.  No significant abnormalities found in her carotids, abdominal aorta, or with ABI.  She does have mild hyperlipidemia 20 mg/day. ? ?EKG:  EKG performed on today reveals sinus tachycardia 107 bpm, biatrial abnormality, but otherwise unremarkable.  No prior tracings available for review. ? ?Recent Labs: ?No results found for requested labs within last 8760 hours.  ?Recent Lipid Panel ?No results found for: CHOL, TRIG, HDL, CHOLHDL, VLDL, LDLCALC, LDLDIRECT ? ?Physical Exam:   ? ?VS:  BP (!) 162/78   Pulse (!) 107   Ht 5\' 1"  (1.549 m)   Wt 120 lb 9.6 oz (54.7 kg)   SpO2 98%   BMI 22.79 kg/m?    ? ?Wt Readings from Last 3 Encounters:  ?06/12/21 120 lb 9.6 oz (54.7 kg)  ?05/02/15 137 lb (62.1 kg)  ?  ? ?GEN: Healthy appearing.  Strong perfume scent.. No acute distress ?HEENT: Normal ?NECK: No JVD. ?LYMPHATICS: No lymphadenopathy ?CARDIAC: 1/6 right upper sternal systolic murmur. RRR no gallop, or edema. ?VASCULAR:  Normal Pulses. No bruits. ?RESPIRATORY:  Clear to auscultation without rales, wheezing or rhonchi  ?ABDOMEN: Soft, non-tender, non-distended, No pulsatile mass, ?MUSCULOSKELETAL: No deformity  ?SKIN: Warm and dry ?NEUROLOGIC:  Alert and oriented x 3 ?PSYCHIATRIC:  Normal affect  ? ?ASSESSMENT:   ? ?1. Heart rate fast   ?2. Cerebellar stroke ([facility])   ?3. Syncope and collapse   ? ?PLAN:   ? ?In order of problems listed above: ? ?Etiology is uncertain.  She is post  thyroidectomy.  Plan to have her wear a 7-day monitor to exclude PSVT, A-fib, a flutter episodes.  She feels that her heart rate increases substantially every day for minutes or longer.  2D Doppler echocardiogram ?Sh

## 2021-06-12 ENCOUNTER — Ambulatory Visit (INDEPENDENT_AMBULATORY_CARE_PROVIDER_SITE_OTHER): Payer: Medicare Other | Admitting: Interventional Cardiology

## 2021-06-12 ENCOUNTER — Ambulatory Visit (INDEPENDENT_AMBULATORY_CARE_PROVIDER_SITE_OTHER): Payer: Medicare Other

## 2021-06-12 ENCOUNTER — Encounter: Payer: Self-pay | Admitting: Interventional Cardiology

## 2021-06-12 VITALS — BP 162/78 | HR 107 | Ht 61.0 in | Wt 120.6 lb

## 2021-06-12 DIAGNOSIS — R000 Tachycardia, unspecified: Secondary | ICD-10-CM

## 2021-06-12 DIAGNOSIS — R55 Syncope and collapse: Secondary | ICD-10-CM

## 2021-06-12 DIAGNOSIS — I639 Cerebral infarction, unspecified: Secondary | ICD-10-CM | POA: Diagnosis not present

## 2021-06-12 DIAGNOSIS — R531 Weakness: Secondary | ICD-10-CM

## 2021-06-12 NOTE — Progress Notes (Unsigned)
[id number] ZIO XT from office inventory applied to patient. ?

## 2021-06-12 NOTE — Patient Instructions (Addendum)
Medication Instructions:  ?Your physician recommends that you continue on your current medications as directed. Please refer to the Current Medication list given to you today. ? ?*If you need a refill on your cardiac medications before your next appointment, please call your pharmacy* ? ?Lab Work: ?NONE ? ?Testing/Procedures: ?Your physician has requested that you have an echocardiogram. Echocardiography is a painless test that uses sound waves to create images of your heart. It provides your doctor with information about the size and shape of your heart and how well your heart?s chambers and valves are working. This procedure takes approximately one hour. There are no restrictions for this procedure. ? ?Your physician has requested for you to have a 7 day Zio heart monitor. ? ?Follow-Up: ?At [facility], you and your health needs are our priority.  As part of our continuing mission to provide you with exceptional heart care, we have created designated Provider Care Teams.  These Care Teams include your primary Cardiologist (physician) and Advanced Practice Providers (APPs -  Physician Assistants and Nurse Practitioners) who all work together to provide you with the care you need, when you need it. ? ?Your next appointment:   ?As needed pending results of monitor and echocardiogram ? ?The format for your next appointment:   ?In Person ? ?Provider:   ?Lesleigh Noe, MD { ? ?Other Instructions ?ZIO XT- Long Term Monitor Instructions ? ?Your physician has requested you wear a ZIO patch monitor for 7 days.  ? ?This is a single patch monitor. [employer] supplies one patch monitor per enrollment. Additional ?stickers are not available. Please do not apply patch if you will be having a Nuclear Stress Test,  ?Echocardiogram, Cardiac CT, MRI, or Chest Xray during the period you would be wearing the  ?monitor. The patch cannot be worn during these tests. You cannot remove and re-apply the  ?ZIO XT patch monitor.  ?Your  ZIO patch monitor will be mailed 3 day USPS to your address on file. It may take 3-5 days  ?to receive your monitor after you have been enrolled.  ?Once you have received your monitor, please review the enclosed instructions. Your monitor  ?has already been registered assigning a specific monitor serial # to you. ? ?Billing and Patient Assistance Program Information ? ?We have supplied [employer] with any of your insurance information on file for billing purposes. ?[employer] offers a sliding scale Patient Assistance Program for patients that do not have  ?insurance, or whose insurance does not completely cover the cost of the ZIO monitor.  ?You must apply for the Patient Assistance Program to qualify for this discounted rate.  ?To apply, please call [employer] at [phone], select option 4, select option 2, ask to apply for  ?Patient Assistance Program. Meredeth Ide will ask your household income, and how many people  ?are in your household. They will quote your out-of-pocket cost based on that information.  ?[employer] will also be able to set up a 38-month, interest-free payment plan if needed. ? ?Applying the monitor ?  ?Shave hair from upper left chest.  ?Hold abrader disc by orange tab. Rub abrader in 40 strokes over the upper left chest as  ?indicated in your monitor instructions.  ?Clean area with 4 enclosed alcohol pads. Let dry.  ?Apply patch as indicated in monitor instructions. Patch will be placed under collarbone on left  ?side of chest with arrow pointing upward.  ?Rub patch adhesive wings for 2 minutes. Remove white label marked "1". Remove the  white  ?label marked "2". Rub patch adhesive wings for 2 additional minutes.  ?While looking in a mirror, press and release button in center of patch. A small green light will  ?flash 3-4 times. This will be your only indicator that the monitor has been turned on.  ?Do not shower for the first 24 hours. You may shower after the first 24 hours.  ?Press the button if you feel  a symptom. You will hear a small click. Record Date, Time and  ?Symptom in the Patient Logbook.  ?When you are ready to remove the patch, follow instructions on the last 2 pages of Patient  ?Logbook. Stick patch monitor onto the last page of Patient Logbook.  ?Place Patient Logbook in the blue and white box. Use locking tab on box and tape box closed  ?securely. The blue and white box has prepaid postage on it. Please place it in the mailbox as  ?soon as possible. Your physician should have your test results approximately 7 days after the  ?monitor has been mailed back to [facility].  ?Call [facility] at [phone] if you have questions regarding  ?your ZIO XT patch monitor. Call them immediately if you see an orange light blinking on your  ?monitor.  ?If your monitor falls off in less than 4 days, contact our Monitor department at [phone].  ?If your monitor becomes loose or falls off after 4 days call [employer] at [phone] for  ?suggestions on securing your monitor ? ?Important Information About Sugar ? ? ? ? ?  ?

## 2021-06-14 ENCOUNTER — Telehealth: Payer: Self-pay | Admitting: Orthopaedic Surgery

## 2021-06-14 NOTE — Telephone Encounter (Signed)
Please advise 

## 2021-06-14 NOTE — Telephone Encounter (Signed)
If both Feet are injected on the 16th can she come back in ?Mid to late June for Knees  ?

## 2021-06-15 NOTE — Telephone Encounter (Signed)
Is she diabetic?  Not sure what kind of foot injections she is having?  We typically only do two injections per visit, unless diabetic for which we do one.  Inejctions in the same place need to be 3 months apart

## 2021-06-15 NOTE — Telephone Encounter (Signed)
Pt isnt diabetic. Advised pt of only having two injections at a time. She stated understanding and will just get her feet injected at the next appt.  ?

## 2021-06-20 ENCOUNTER — Encounter: Payer: Self-pay | Admitting: Orthopaedic Surgery

## 2021-06-20 ENCOUNTER — Ambulatory Visit (INDEPENDENT_AMBULATORY_CARE_PROVIDER_SITE_OTHER): Payer: Medicare Other | Admitting: Orthopaedic Surgery

## 2021-06-20 DIAGNOSIS — I639 Cerebral infarction, unspecified: Secondary | ICD-10-CM

## 2021-06-20 DIAGNOSIS — M2021 Hallux rigidus, right foot: Secondary | ICD-10-CM | POA: Diagnosis not present

## 2021-06-20 DIAGNOSIS — M2022 Hallux rigidus, left foot: Secondary | ICD-10-CM

## 2021-06-20 MED ORDER — ACETAMINOPHEN-CODEINE #3 300-30 MG PO TABS: 1.0000 | ORAL_TABLET | Freq: Two times a day (BID) | ORAL | 2 refills | Status: AC | PRN

## 2021-06-20 NOTE — Progress Notes (Signed)
? ?Office Visit Note ?  ?Patient: Cassidy Jones           ?Date of Birth: [birth year]           ?MRN: [id number] ?Visit Date: 06/20/2021 ?             ?Requested by: Crist Infante, MD ?[address] ?[place],  [place] [address] ?PCP: Crist Infante, MD ? ? ?Assessment & Plan: ?Visit Diagnoses:  ?1. Hallux rigidus, right foot   ?2. Hallux rigidus of left foot   ? ? ?Plan: Impression is bilateral foot hallux rigidus.  Today, repeated cortisone injections to the first MTP joint.  She will follow-up with [place] as needed. ? ?Follow-Up Instructions: Return if symptoms worsen or fail to improve.  ? ?Orders:  ?Orders Placed This Encounter  ?Procedures  ? Small Joint Inj  ? ?No orders of the defined types were placed in this encounter. ? ? ? ? Procedures: ?Small Joint Inj on 06/20/2021 3:54 PM ? ? ? ? ?Clinical Data: ?No additional findings. ? ? ?Subjective: ?Chief Complaint  ?Patient presents with  ? Right Foot - Injections  ? Left Foot - Injections  ? ? ?HPI patient is a pleasant 76 year old female who comes in today with recurrent bilateral foot pain.  History of underlying hallux rigidus.  She has had multiple cortisone injections to the first MTP joint with the last ones being in September 2022.  She has had good relief.  Symptoms are intermittent without specific aggravators but does note she tries to limit her use of wearing high heels.  She does use heat at home which provides some relief. ? ?Review of Systems as detailed in HPI.  All others reviewed and are negative. ? ? ?Objective: ?Vital Signs: There were no vitals taken for this visit. ? ?Physical Exam well-developed well-nourished female no acute distress.  Alert and oriented x3. ? ?Ortho Exam bilateral foot exam shows painful dorsiflexion ? ?Specialty Comments:  ?No specialty comments available. ? ?Imaging: ?No new imaging ? ? ?PMFS History: ?Patient Active Problem List  ? Diagnosis Date Noted  ? Hallux rigidus of left foot 01/12/2020  ? Pain in left foot 01/06/2016   ? Hallux rigidus, right foot 01/06/2016  ? 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 ?Past Medical History:  ?Diagnosis Date  ? ADHD (attention deficit hyperactivity disorder)   ? Anxiety   ? Arthritis   ? hands, feet, neck  ? 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 Depression   ? History of blood transfusion 2005  ? at [facility]  ? Hyperlipidemia   ? Hypertension   ? Hypothyroidism   ? Renal disorder   ? stage 3 kidney disease  ? Stroke [facility])   ? years ago, no deficits  ? Thyroid disease   ? thyroid gland removed  ? UC (ulcerative colitis) ([place])   ? and colon polyps  ?  ?History reviewed. No pertinent family history.  ?Past Surgical History:  ?Procedure Laterality Date  ? cataract surgery Bilateral   ? colon removed    ? x 4 surgeries  Hx UC and colon polyps  ? COLON SURGERY    ? COLONOSCOPY    ? EYE SURGERY    ? goiters removed    ? x 2 at age 35, age 23  ? HYSTEROSCOPY WITH D & C N/A 05/06/2015  ? Procedure: DILATATION AND CURETTAGE /HYSTEROSCOPY;  Surgeon: Azucena Fallen, MD;  Location: [facility];  Service: Gynecology;  Laterality:  N/A;  ? WISDOM TOOTH EXTRACTION    ? ?Social History  ? ?Occupational History  ? Not on file  ?Tobacco Use  ? Smoking status: Never  ? Smokeless tobacco: Never  ?Substance and Sexual Activity  ? Alcohol use: Yes  ?  Alcohol/week: 10.0 standard drinks  ?  Types: 10 Glasses of wine per week  ?  Comment: wine  6 oz wine every night  ? Drug use: No  ? Sexual activity: Yes  ?  Birth control/protection: Post-menopausal  ? ? ? ? ? ? ?

## 2021-06-30 ENCOUNTER — Ambulatory Visit (HOSPITAL_COMMUNITY): Payer: Medicare Other | Attending: Cardiovascular Disease

## 2021-06-30 DIAGNOSIS — R55 Syncope and collapse: Secondary | ICD-10-CM | POA: Insufficient documentation

## 2021-06-30 DIAGNOSIS — R000 Tachycardia, unspecified: Secondary | ICD-10-CM | POA: Insufficient documentation

## 2021-06-30 LAB — ECHOCARDIOGRAM COMPLETE
Area-P 1/2: 3.6 cm2
S' Lateral: 2 cm

## 2021-07-04 ENCOUNTER — Telehealth: Payer: Self-pay

## 2021-07-04 DIAGNOSIS — R000 Tachycardia, unspecified: Secondary | ICD-10-CM

## 2021-07-04 NOTE — Telephone Encounter (Signed)
-----   Message from Lyn Records, MD sent at 07/03/2021 12:40 PM EDT ----- Let the patient know we should refer to electrophysiology to determine if a strategy other than continued medical therapy is needed.  We need to exclude the possibility that pacemaker therapy would be indicated.  Find out from the patient if she could have been sleeping at 826 on May 11th 2000 23 when the bradycardia noted on the monitor occurred? A copy will be sent to Patrcia Dolly, DO

## 2021-07-04 NOTE — Telephone Encounter (Signed)
Spoke with patient and discussed heart monitor results.  Per Dr. Katrinka Blazing: Let the patient know we should refer to electrophysiology to determine if a strategy other than continued medical therapy is needed.  We need to exclude the possibility that pacemaker therapy would be indicated.  Find out from the patient if she could have been sleeping at 826 on May 11th 2000 23 when the bradycardia noted on the monitor occurred?  Patient reports she may have been napping on 06/15/21 at 8:26PM, though she is not sure. She said "it wouldn't be unusual for me to be napping at that time."  Patient states her HR has been high 90's-100, stating it makes her exhausted and she has to lie down and rest. She reports this is negatively impacting her life as she is active and has to take care of herself.  Referral to EP ordered, scheduler to call and make appt with patient.  Patient verbalized understanding.

## 2021-08-03 ENCOUNTER — Encounter: Payer: Self-pay | Admitting: Internal Medicine

## 2021-08-03 ENCOUNTER — Ambulatory Visit (INDEPENDENT_AMBULATORY_CARE_PROVIDER_SITE_OTHER): Payer: Medicare Other | Admitting: Internal Medicine

## 2021-08-03 DIAGNOSIS — R001 Bradycardia, unspecified: Secondary | ICD-10-CM

## 2021-08-03 NOTE — Progress Notes (Signed)
ELECTROPHYSIOLOGY CONSULT NOTE  Patient ID: Cassidy Jones, MRN: [id number], DOB/AGE: 06/14/1945 76 y.o. Admit date: (Not on file) Date of Consult: 08/03/2021  Primary Physician: Crist Infante, MD Primary Cardiologist: [facility]     Cassidy Jones is a 76 y.o. female who is being seen today for the evaluation of abnormal event recorder at the request of [place].    HPI Cassidy Jones is a 76 y.o. female with complaints of "coming out of her skin".  Longstanding history of anxiety, higher heart rates and blood pressures, this was a major issue until she was about 38.  She is under the care of Dr. Jimmy Footman blood pressures were controlled with felodipine.  Her younger sister died about a 2 and half years ago, and then her husband about a year and a half ago.  Its been very hard.  Over the last 4-6 months, this has become problematic again, the spells typically last 30 minutes or so and then abate.  In the past, she had had palpitations.  No longer with these events.  No associated lightheadedness or shortness of breath.  She has adult onset attention deficit disorder for which she takes Vyvanse 40.  On her own, she tried to see whether lower doses ameliorated her symptoms; it did not.  A couple of episodes of syncope.  Both occurred upon awakening.  She was given an event recorder.  This was associated with number of episodes of tachycardia up to rate of 200.  There is also an episode at 2000 hrs. where her heart rate gradually slowed to a pause of 4-1/2 seconds.  This was not associated with any recalled symptoms.  It could have been while she was sleeping.  (Weird day night cycle stuff)  Bilateral goiter hyperthyroidism required a thyroidectomy, ulcerative colitis on high-dose steroids ultimately requiring a colectomy. 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/2015   Depression    History of blood transfusion 2005   at [facility]   Hyperlipidemia    Hypertension    Hypothyroidism    Renal disorder    stage 3 kidney disease   Stroke [facility])    years ago, no deficits   Thyroid disease    thyroid gland removed   UC (ulcerative colitis) ([place])    and colon polyps      Surgical History:  Past Surgical History:  Procedure Laterality Date   cataract surgery Bilateral    colon removed     x 4 surgeries  Hx UC and colon polyps   COLON SURGERY     COLONOSCOPY     EYE SURGERY     goiters removed     x 2 at age 28, age 19   HYSTEROSCOPY WITH D & C N/A 05/06/2015   Procedure: DILATATION AND CURETTAGE /HYSTEROSCOPY;  Surgeon: Azucena Fallen, MD;  Location: [facility];  Service: Gynecology;  Laterality: N/A;   WISDOM TOOTH EXTRACTION       Home Meds: Current Meds  Medication Sig  acetaminophen (TYLENOL) 500 MG tablet Take 500 mg by mouth as needed.   acetaminophen-codeine (TYLENOL #3) 300-30 MG tablet Take 1 tablet by mouth 2 (two) times daily as needed for moderate pain.   aspirin 81 MG chewable tablet Chew 81 mg by mouth daily.   B Complex Vitamins (VITAMIN B-COMPLEX) TABS Take 1 tablet by mouth daily.   calcitonin, salmon, (MIACALCIN/FORTICAL) 200 UNIT/ACT nasal spray Place 1 spray into alternate nostrils daily.    calcitRIOL (ROCALTROL) 0.25 MCG capsule Take 0.25-0.5 mcg by mouth See admin instructions. Patient alternates between 0.35mcg and 0.76mcg daily   calcium carbonate 200 MG capsule Take 600 mg by mouth daily.    ciprofloxacin (CIPRO) 500 MG tablet Take 500 mg by mouth daily.    CODEINE SULFATE PO Take by mouth. With peppermint oil 7.5mg /ml 4 times per day   diclofenac sodium (VOLTAREN) 1 % GEL Apply 2 g topically 4 (four) times daily.   diclofenac sodium  (VOLTAREN) 1 % GEL Apply 2 g topically 4 (four) times daily.   estradiol (CLIMARA - DOSED IN MG/24 HR) 0.025 mg/24hr patch Place 1 patch onto the skin once a week.    felodipine (PLENDIL) 5 MG 24 hr tablet Take 2.5 tablets by mouth at bedtime.    Glucosamine-Chondroitin 750-600 MG CHEW Chew by mouth.   LORazepam (ATIVAN) 1 MG tablet Take 0.25-1 tablets by mouth 3 (three) times daily. 1 tab mid-morning, 1/2 tab in the afternoon and 1/4 tab at bedtime as needed for anxiety   MELATONIN PO Take 3 mg by mouth daily.   metroNIDAZOLE (METROGEL) 0.75 % vaginal gel Place 1 Applicatorful vaginally See admin instructions. 5 times per week   Multiple Vitamin (MULTIVITAMIN WITH MINERALS) TABS tablet Take 1 tablet by mouth daily.   Multiple Vitamins-Minerals (PRESERVISION AREDS 2 PO) Take by mouth 2 (two) times daily.   Multiple Vitamins-Minerals (ZINC PO) Take 5 mg by mouth daily.   Omega-3 Fatty Acids (FISH OIL OMEGA-3 PO) Take 300 mg by mouth daily.   oxyCODONE-acetaminophen (PERCOCET) 5-325 MG tablet Take 1 tablet by mouth daily as needed for severe pain.   potassium chloride SA (K-DUR,KLOR-CON) 20 MEQ tablet Take 1 tablet by mouth every evening.    pravastatin (PRAVACHOL) 20 MG tablet Take 1 tablet by mouth every evening.    PRESCRIPTION MEDICATION Place 1 suppository rectally See admin instructions. Metronidazole 125mg  Suppository: 1 suppository per rectum 5 times per week.   Sertraline HCl (ZOLOFT PO) Take 12.5 mg by mouth daily.   SYNTHROID 75 MCG tablet Take 1 tablet by mouth daily.   terconazole (TERAZOL 7) 0.4 % vaginal cream Place 1 applicator vaginally once a week.    Turmeric Curcumin 500 MG CAPS Take 1 capsule by mouth daily.   vitamin B-12 (CYANOCOBALAMIN) 1000 MCG tablet Take 1,000 mcg by mouth daily.   VYVANSE 40 MG capsule Take 10 mg by mouth daily.    zolpidem (AMBIEN) 5 MG tablet Take 1 tablet by mouth at bedtime as needed. sleep    Allergies:  Allergies  Allergen Reactions    Mercaptopurine     Other reaction(s): Other (See Comments) Kidney failure    Metronidazole     Cannot take the tablets due to numbness in her toes. Can take active ingredient     Social History   Socioeconomic History   Marital status: Married    Spouse name: Not on file   Number of children: Not on file   Years of education: Not on file  Highest education level: Not on file  Occupational History   Not on file  Tobacco Use   Smoking status: Never   Smokeless tobacco: Never  Substance and Sexual Activity   Alcohol use: Yes    Alcohol/week: 10.0 standard drinks of alcohol    Types: 10 Glasses of wine per week    Comment: wine  6 oz wine every night   Drug use: No   Sexual activity: Yes    Birth control/protection: Post-menopausal  Other Topics Concern   Not on file  Social History Narrative   Not on file   Social Determinants of Health   Financial Resource Strain: Not on file  Food Insecurity: Not on file  Transportation Needs: Not on file  Physical Activity: Not on file  Stress: Not on file  Social Connections: Not on file  Intimate Partner Violence: Not on file     History reviewed. No pertinent family history.   ROS:  Please see the history of present illness.     All other systems reviewed and negative.    Physical Exam: Blood pressure (!) 148/76, pulse 94, height 5\' 1"  (1.549 m), weight 121 lb 9.6 oz (55.2 kg), SpO2 98 %. General: Well developed, well nourished female in no acute distress. Head: Normocephalic, atraumatic, sclera non-icteric, no xanthomas, nares are without discharge. EENT: normal  Lymph Nodes:  none Neck: Negative for carotid bruits. JVD not elevated. Back:without scoliosis kyphosis Lungs: Clear bilaterally to auscultation without wheezes, rales, or rhonchi. Breathing is unlabored. Heart: Rapid RR with S1 S2. No  murmur . No rubs, or gallops appreciated. Abdomen: Soft, non-tender, non-distended with normoactive bowel sounds. No  hepatomegaly. No rebound/guarding. No obvious abdominal masses. Msk:  Strength and tone appear normal for age. Extremities: No clubbing or cyanosis. No edema.  Distal pedal pulses are 2+ and equal bilaterally. Skin: Warm and Dry Neuro: Alert and oriented X 3. CN III-XII intact Grossly normal sensory and motor function . Psych:  Responds to questions appropriately with a normal affect.        EKG: Sinus at 94 intervals 13/07/36 Biatrial enlargement (normal on echo)  Event recorder is as outlined above  Assessment and Plan:  "Coming out of her skin "  Atrial tachycardia with scant palpitations  Hypertension  Syncope probably orthostatic/neurally mediated  Sinus slowing presumably associated with sleep question sleep apnea  Ulcerative colitis status post colectomy-remotely  Significant loss  ADD  Depression and anxiety  I wonder with her "coming out of her skin" is related to her atrial tachycardia that is recorded.  I have asked that she get a FitBit to try and discern whether this sensation is associated with an abrupt change in heart rate.  Based on the monitor, we will make a presumption and try her on low-dose beta-blockers.  This in conjunction with her felodipine which she is taking her blood pressure.  We have given her prescriptions for atenolol 25 metoprolol succinate 25 and nebivolol 2.5.  I do not think that her bradycardia is primarily arrhythmic and hence do not think that we are going to aggravate  I am somewhat surprised that her symptoms did not abate with the decrease of her stimulant.  Depending on what the correlations we see with me after to try it again as well as potentially coffee.  Her syncope I suspect is orthostatic.  Potentially complicated by intravascular depletion.  We have reviewed the physiology.  Blood pressure is mildly elevated.  Numbers are typically 120-150.  The beta-blocker may help with this also.  Discussed grief.  The losses.   Suggested that she might try and reestablish community      Virl Axe

## 2021-08-03 NOTE — Patient Instructions (Signed)
Medication Instructions:  Your physician has recommended you make the following change in your medication:   You are being given three prescriptions for different beta blockers to try.  You may take these in any order. DO NOT TAKE MORE THAN ONE BETA BLOCKER AT A TIME. Choose one beta blocker to start with. If after two weeks, you feel the medication is working well for you, continue that current medication. If it does not work well for you, try another prescription for a beta blocker at that time. You may continue that current beta blocker at that time if it works well for you. If it does not, repeat these instructions for the third beta blocker.  When you find a beta blocker that works well for you, call the office and we will send in a prescription for you.    *If you need a refill on your cardiac medications before your next appointment, please call your pharmacy*   Lab Work: None ordered.  If you have labs (blood work) drawn today and your tests are completely normal, you will receive your results only by: MyChart Message (if you have MyChart) OR A paper copy in the mail If you have any lab test that is abnormal or we need to change your treatment, we will call you to review the results.   Testing/Procedures: None ordered.    Follow-Up: At [facility], you and your health needs are our priority.  As part of our continuing mission to provide you with exceptional heart care, we have created designated Provider Care Teams.  These Care Teams include your primary Cardiologist (physician) and Advanced Practice Providers (APPs -  Physician Assistants and Nurse Practitioners) who all work together to provide you with the care you need, when you need it.  We recommend signing up for the patient portal called "MyChart".  Sign up information is provided on this After Visit Summary.  MyChart is used to connect with patients for Virtual Visits (Telemedicine).  Patients are able to view  lab/test results, encounter notes, upcoming appointments, etc.  Non-urgent messages can be sent to your provider as well.   To learn more about what you can do with MyChart, go to [email or web address].    Your next appointment:   3 months with Dr Graciela Husbands  Important Information About Sugar

## 2021-08-15 ENCOUNTER — Ambulatory Visit (INDEPENDENT_AMBULATORY_CARE_PROVIDER_SITE_OTHER): Payer: Medicare Other | Admitting: Orthopaedic Surgery

## 2021-08-15 ENCOUNTER — Encounter: Payer: Self-pay | Admitting: Orthopaedic Surgery

## 2021-08-15 DIAGNOSIS — M1711 Unilateral primary osteoarthritis, right knee: Secondary | ICD-10-CM | POA: Diagnosis not present

## 2021-08-15 DIAGNOSIS — I639 Cerebral infarction, unspecified: Secondary | ICD-10-CM | POA: Diagnosis not present

## 2021-08-15 MED ORDER — LIDOCAINE HCL 1 % IJ SOLN
2.0000 mL | INTRAMUSCULAR | Status: AC | PRN
  Administered 2021-08-15: 2 mL

## 2021-08-15 MED ORDER — METHYLPREDNISOLONE ACETATE 40 MG/ML IJ SUSP
40.0000 mg | INTRAMUSCULAR | Status: AC | PRN
  Administered 2021-08-15: 40 mg via INTRA_ARTICULAR

## 2021-08-15 MED ORDER — BUPIVACAINE HCL 0.5 % IJ SOLN
2.0000 mL | INTRAMUSCULAR | Status: AC | PRN
  Administered 2021-08-15: 2 mL via INTRA_ARTICULAR

## 2021-08-15 NOTE — Progress Notes (Signed)
Office Visit Note   Patient: Cassidy Jones           Date of Birth: 08/17/1945           MRN: [id number] Visit Date: 08/15/2021              Requested by: Cassidy Ran, MD [address] [place],  [place] [address] PCP: Cassidy Ran, MD   Assessment & Plan: Visit Diagnoses:  1. Primary osteoarthritis of right knee     Plan: Impression is right knee OA.  Cortisone was reinjected today.  Patient tolerated well.  We will see her back as needed.  Follow-Up Instructions: No follow-ups on file.   Orders:  No orders of the defined types were placed in this encounter.  No orders of the defined types were placed in this encounter.     Procedures: Large Joint Inj: R knee on 08/15/2021 3:23 PM Indications: pain Details: 22 G needle  Arthrogram: No  Medications: 40 mg methylPREDNISolone acetate 40 MG/ML; 2 mL lidocaine 1 %; 2 mL bupivacaine 0.5 % Consent was given by the patient. Patient was prepped and draped in the usual sterile fashion.       Clinical Data: No additional findings.   Subjective: Chief Complaint  Patient presents with   Right Knee - Pain    HPI Cassidy Jones returns today for follow-up of right knee OA.  Last injection was about 5 months ago.  She is requesting another injection.  Previous injection did work fairly well. Review of Systems   Objective: Vital Signs: There were no vitals taken for this visit.  Physical Exam  Ortho Exam Right knee exam is unchanged. Specialty Comments:  No specialty comments available.  Imaging: No results found.   PMFS History: Patient Active Problem List   Diagnosis Date Noted   Primary osteoarthritis of right knee 08/15/2021   Bradycardia 08/03/2021   Hallux rigidus of left foot 01/12/2020   Pain in left foot 01/06/2016   Hallux rigidus, right foot 01/06/2016   Complex endometrial hyperplasia without atypia 05/06/2015   Past Medical History:  Diagnosis Date   ADHD (attention deficit  hyperactivity disorder)    Anxiety    Arthritis    hands, feet, neck   Complex endometrial hyperplasia without atypia 05/06/2015   Depression    History of blood transfusion 2005   at [facility]   Hyperlipidemia    Hypertension    Hypothyroidism    Renal disorder    stage 3 kidney disease   Stroke [facility])    years ago, no deficits   Thyroid disease    thyroid gland removed   UC (ulcerative colitis) (HCC)    and colon polyps    No family history on file.  Past Surgical History:  Procedure Laterality Date   cataract surgery Bilateral    colon removed     x 4 surgeries  Hx UC and colon polyps   COLON SURGERY     COLONOSCOPY     EYE SURGERY     goiters removed     x 2 at age 89, age 53   HYSTEROSCOPY WITH D & C N/A 05/06/2015   Procedure: DILATATION AND CURETTAGE /HYSTEROSCOPY;  Surgeon: Cassidy Evans, MD;  Location: [facility];  Service: Gynecology;  Laterality: N/A;   WISDOM TOOTH EXTRACTION     Social History   Occupational History   Not on file  Tobacco Use   Smoking status: Never   Smokeless tobacco: Never  Substance and Sexual Activity   Alcohol use: Yes    Alcohol/week: 10.0 standard drinks of alcohol    Types: 10 Glasses of wine per week    Comment: wine  6 oz wine every night   Drug use: No   Sexual activity: Yes    Birth control/protection: Post-menopausal

## 2021-10-12 ENCOUNTER — Telehealth: Payer: Self-pay | Admitting: Internal Medicine

## 2021-10-12 NOTE — Telephone Encounter (Signed)
Pt c/o medication issue:  1. Name of Medication: Nebivolol 2.5 MG  2. How are you currently taking this medication (dosage and times per day)?   3. Are you having a reaction (difficulty breathing--STAT)? No  4. What is your medication issue? Pt states that she was told to call and let provider know if medication worked. Pt states that it does work and she would like a 90 day supply called into pharmacy on file. Please advise

## 2021-10-13 MED ORDER — NEBIVOLOL HCL 2.5 MG PO TABS: 2.5000 mg | ORAL_TABLET | Freq: Every day | ORAL | 3 refills | Status: DC

## 2021-10-13 NOTE — Telephone Encounter (Signed)
Nebivolol 2.5mg  - 1 tablet by  mouth daily #90 with 3RF sent to pharmacy as requested.

## 2021-11-02 DIAGNOSIS — R55 Syncope and collapse: Secondary | ICD-10-CM | POA: Insufficient documentation

## 2021-11-02 DIAGNOSIS — I471 Supraventricular tachycardia: Secondary | ICD-10-CM | POA: Insufficient documentation

## 2021-11-03 ENCOUNTER — Ambulatory Visit: Payer: Medicare Other | Admitting: Internal Medicine

## 2021-11-29 ENCOUNTER — Ambulatory Visit: Payer: Self-pay

## 2021-11-29 NOTE — Patient Outreach (Signed)
  Care Coordination   11/29/2021 Name: Cassidy Jones MRN: [id number] DOB: 07/26/1945   Care Coordination Outreach Attempts:  An unsuccessful telephone outreach was attempted today to offer the patient information about available care coordination services as a benefit of their health plan.   Follow Up Plan:  Additional outreach attempts will be made to offer the patient care coordination information and services.   Encounter Outcome:  No Answer  Care Coordination Interventions Activated:  No   Care Coordination Interventions:  No, not indicated    Daneen Schick, BSW, CDP Social Worker, Certified Dementia Practitioner [facility] Care Management  Care Coordination [phone]

## 2021-12-01 ENCOUNTER — Other Ambulatory Visit (HOSPITAL_COMMUNITY): Payer: Self-pay

## 2021-12-04 ENCOUNTER — Ambulatory Visit (HOSPITAL_COMMUNITY)
Admission: RE | Admit: 2021-12-04 | Discharge: 2021-12-04 | Disposition: A | Discharge status: Home / Self Care | Payer: Medicare Other | Source: Ambulatory Visit | Attending: Internal Medicine | Admitting: Internal Medicine

## 2021-12-04 DIAGNOSIS — M810 Age-related osteoporosis without current pathological fracture: Secondary | ICD-10-CM | POA: Insufficient documentation

## 2021-12-04 MED ORDER — DENOSUMAB 60 MG/ML ~~LOC~~ SOSY: 60.0000 mg | PREFILLED_SYRINGE | Freq: Once | SUBCUTANEOUS | Status: AC

## 2021-12-04 MED ORDER — DENOSUMAB 60 MG/ML ~~LOC~~ SOSY
PREFILLED_SYRINGE | SUBCUTANEOUS | Status: AC
  Administered 2021-12-04: 60 mg via SUBCUTANEOUS
  Filled 2021-12-04: qty 1

## 2021-12-05 ENCOUNTER — Encounter: Payer: Self-pay | Admitting: Orthopaedic Surgery

## 2021-12-05 ENCOUNTER — Ambulatory Visit (INDEPENDENT_AMBULATORY_CARE_PROVIDER_SITE_OTHER): Payer: Medicare Other | Admitting: Orthopaedic Surgery

## 2021-12-05 DIAGNOSIS — I639 Cerebral infarction, unspecified: Secondary | ICD-10-CM | POA: Diagnosis not present

## 2021-12-05 DIAGNOSIS — M2021 Hallux rigidus, right foot: Secondary | ICD-10-CM | POA: Diagnosis not present

## 2021-12-05 DIAGNOSIS — M2022 Hallux rigidus, left foot: Secondary | ICD-10-CM | POA: Diagnosis not present

## 2021-12-05 MED ORDER — METHYLPREDNISOLONE ACETATE 40 MG/ML IJ SUSP
40.0000 mg | INTRAMUSCULAR | Status: AC | PRN
  Administered 2021-12-05: 40 mg via INTRA_ARTICULAR

## 2021-12-05 NOTE — Progress Notes (Signed)
Office Visit Note   Patient: Cassidy Jones           Date of Birth: [birth year]           MRN: [id number] Visit Date: 12/05/2021              Requested by: Crist Infante, MD [address] [place],  [place] [address] PCP: Crist Infante, MD   Assessment & Plan: Visit Diagnoses:  1. Hallux rigidus, right foot   2. Hallux rigidus of left foot     Plan: Junko returns today for bilateral hallux rigidus.  Requesting injections today.  She last had them about 6 months ago.  She underwent the injection well.  We will see her back as needed.  Follow-Up Instructions: No follow-ups on file.   Orders:  No orders of the defined types were placed in this encounter.  No orders of the defined types were placed in this encounter.     Procedures: Small Joint Inj: bilateral great MTP on 12/05/2021 4:27 PM Medications (Right): 40 mg methylPREDNISolone acetate 40 MG/ML Medications (Left): 40 mg methylPREDNISolone acetate 40 MG/ML      Clinical Data: No additional findings.   Subjective: Chief Complaint  Patient presents with   Right Foot - Pain   Left Foot - Pain    HPI  Review of Systems   Objective: Vital Signs: There were no vitals taken for this visit.  Physical Exam  Ortho Exam  Specialty Comments:  No specialty comments available.  Imaging: No results found.   PMFS History: Patient Active Problem List   Diagnosis Date Noted   Atrial tachycardia - with scant palpitations 11/02/2021   Syncope 11/02/2021   Primary osteoarthritis of right knee 08/15/2021   Bradycardia 08/03/2021   Hallux rigidus of left foot 01/12/2020   Pain in left foot 01/06/2016   Hallux rigidus, right foot 01/06/2016   Complex endometrial hyperplasia without atypia 05/06/2015   Past Medical History:  Diagnosis Date   ADHD (attention deficit hyperactivity disorder)    Anxiety    Arthritis    hands, feet, neck   Complex endometrial hyperplasia without atypia 05/06/2015    Depression    History of blood transfusion 2005   at [facility]   Hyperlipidemia    Hypertension    Hypothyroidism    Renal disorder    stage 3 kidney disease   Stroke [facility])    years ago, no deficits   Thyroid disease    thyroid gland removed   UC (ulcerative colitis) ([place])    and colon polyps    No family history on file.  Past Surgical History:  Procedure Laterality Date   cataract surgery Bilateral    colon removed     x 4 surgeries  Hx UC and colon polyps   COLON SURGERY     COLONOSCOPY     EYE SURGERY     goiters removed     x 2 at age 22, age 54   HYSTEROSCOPY WITH D & C N/A 05/06/2015   Procedure: DILATATION AND CURETTAGE /HYSTEROSCOPY;  Surgeon: Azucena Fallen, MD;  Location: [facility];  Service: Gynecology;  Laterality: N/A;   WISDOM TOOTH EXTRACTION     Social History   Occupational History   Not on file  Tobacco Use   Smoking status: Never   Smokeless tobacco: Never  Substance and Sexual Activity   Alcohol use: Yes    Alcohol/week: 10.0 standard drinks of alcohol    Types:  10 Glasses of wine per week    Comment: wine  6 oz wine every night   Drug use: No   Sexual activity: Yes    Birth control/protection: Post-menopausal

## 2021-12-19 ENCOUNTER — Encounter: Payer: Self-pay | Admitting: Orthopaedic Surgery

## 2021-12-19 ENCOUNTER — Ambulatory Visit (INDEPENDENT_AMBULATORY_CARE_PROVIDER_SITE_OTHER): Payer: Medicare Other | Admitting: Orthopaedic Surgery

## 2021-12-19 DIAGNOSIS — I639 Cerebral infarction, unspecified: Secondary | ICD-10-CM | POA: Diagnosis not present

## 2021-12-19 DIAGNOSIS — M1711 Unilateral primary osteoarthritis, right knee: Secondary | ICD-10-CM

## 2021-12-19 MED ORDER — LIDOCAINE HCL 1 % IJ SOLN
2.0000 mL | INTRAMUSCULAR | Status: AC | PRN
  Administered 2021-12-19: 2 mL

## 2021-12-19 MED ORDER — BUPIVACAINE HCL 0.5 % IJ SOLN
2.0000 mL | INTRAMUSCULAR | Status: AC | PRN
  Administered 2021-12-19: 2 mL via INTRA_ARTICULAR

## 2021-12-19 MED ORDER — METHYLPREDNISOLONE ACETATE 40 MG/ML IJ SUSP
40.0000 mg | INTRAMUSCULAR | Status: AC | PRN
  Administered 2021-12-19: 40 mg via INTRA_ARTICULAR

## 2021-12-19 NOTE — Progress Notes (Signed)
Office Visit Note   Patient: Cassidy Jones           Date of Birth: [birth year]           MRN: [id number] Visit Date: 12/19/2021              Requested by: Rodrigo Ran, MD [address] [place],  [place] [address] PCP: Rodrigo Ran, MD   Assessment & Plan: Visit Diagnoses:  1. Primary osteoarthritis of right knee     Plan: Baudelia returns today for follow-up of right knee osteoarthritis.  Last injection was July.  Requesting another one today.  She tolerated the injection well.  Follow-up as needed.  Follow-Up Instructions: No follow-ups on file.   Orders:  No orders of the defined types were placed in this encounter.  No orders of the defined types were placed in this encounter.     Procedures: Large Joint Inj: R knee on 12/19/2021 3:12 PM Indications: pain Details: 22 G needle  Arthrogram: No  Medications: 40 mg methylPREDNISolone acetate 40 MG/ML; 2 mL lidocaine 1 %; 2 mL bupivacaine 0.5 % Consent was given by the patient. Patient was prepped and draped in the usual sterile fashion.       Clinical Data: No additional findings.   Subjective: Chief Complaint  Patient presents with   Right Knee - Pain    HPI  Review of Systems   Objective: Vital Signs: There were no vitals taken for this visit.  Physical Exam  Ortho Exam  Specialty Comments:  No specialty comments available.  Imaging: No results found.   PMFS History: Patient Active Problem List   Diagnosis Date Noted   Atrial tachycardia - with scant palpitations 11/02/2021   Syncope 11/02/2021   Primary osteoarthritis of right knee 08/15/2021   Bradycardia 08/03/2021   Hallux rigidus of left foot 01/12/2020   Pain in left foot 01/06/2016   Hallux rigidus, right foot 01/06/2016   Complex endometrial hyperplasia without atypia 05/06/2015   Past Medical History:  Diagnosis Date   ADHD (attention deficit hyperactivity disorder)    Anxiety    Arthritis    hands, feet, neck    Complex endometrial hyperplasia without atypia 05/06/2015   Depression    History of blood transfusion 2005   at [facility]   Hyperlipidemia    Hypertension    Hypothyroidism    Renal disorder    stage 3 kidney disease   Stroke [facility])    years ago, no deficits   Thyroid disease    thyroid gland removed   UC (ulcerative colitis) (HCC)    and colon polyps    No family history on file.  Past Surgical History:  Procedure Laterality Date   cataract surgery Bilateral    colon removed     x 4 surgeries  Hx UC and colon polyps   COLON SURGERY     COLONOSCOPY     EYE SURGERY     goiters removed     x 2 at age 89, age 19   HYSTEROSCOPY WITH D & C N/A 05/06/2015   Procedure: DILATATION AND CURETTAGE /HYSTEROSCOPY;  Surgeon: Shea Evans, MD;  Location: [facility];  Service: Gynecology;  Laterality: N/A;   WISDOM TOOTH EXTRACTION     Social History   Occupational History   Not on file  Tobacco Use   Smoking status: Never   Smokeless tobacco: Never  Substance and Sexual Activity   Alcohol use: Yes    Alcohol/week:  10.0 standard drinks of alcohol    Types: 10 Glasses of wine per week    Comment: wine  6 oz wine every night   Drug use: No   Sexual activity: Yes    Birth control/protection: Post-menopausal

## 2022-01-25 ENCOUNTER — Ambulatory Visit: Payer: Medicare Other | Attending: Internal Medicine | Admitting: Internal Medicine

## 2022-01-25 ENCOUNTER — Encounter: Payer: Self-pay | Admitting: Internal Medicine

## 2022-01-25 VITALS — BP 116/78 | HR 72 | Ht 61.0 in | Wt 123.8 lb

## 2022-01-25 DIAGNOSIS — R001 Bradycardia, unspecified: Secondary | ICD-10-CM

## 2022-01-25 DIAGNOSIS — I4719 Other supraventricular tachycardia: Secondary | ICD-10-CM | POA: Diagnosis not present

## 2022-01-25 DIAGNOSIS — R55 Syncope and collapse: Secondary | ICD-10-CM | POA: Diagnosis not present

## 2022-01-25 DIAGNOSIS — I639 Cerebral infarction, unspecified: Secondary | ICD-10-CM

## 2022-01-25 NOTE — Progress Notes (Signed)
Patient Care Team: Rodrigo Ran, MD as PCP - General (Internal Medicine) Lyn Records, MD as PCP - Cardiology (Cardiology)   HPI  Cassidy Jones is a 76 y.o. female Seen in follow-up for atrial tachycardia with a sensation of "coming out of her skin" which I wonder whether were related.  We tried her on a beta-blocker trial including atenolol, succinate, nebivolol.  Both atenolol and nebivolol were associated with decreasing pulse.  This coming out of her skin also decreased so we were probably right and that it was associated with her atrial tachycardia.  Blood pressures are well-controlled in the 120s.  No limited patient's in exercise tolerance.  Quite anxious.  Intermittently limiting.  She started to read [employer] which has been a great blessing and help    Records and Results Reviewed    Past Medical History:  Diagnosis Date   ADHD (attention deficit hyperactivity disorder)    Anxiety    Arthritis    hands, feet, neck   Complex endometrial hyperplasia without atypia 05/06/2015   Depression    History of blood transfusion 2005   at [facility]   Hyperlipidemia    Hypertension    Hypothyroidism    Renal disorder    stage 3 kidney disease   Stroke [facility])    years ago, no deficits   Thyroid disease    thyroid gland removed   UC (ulcerative colitis) (HCC)    and colon polyps    Past Surgical History:  Procedure Laterality Date   cataract surgery Bilateral    colon removed     x 4 surgeries  Hx UC and colon polyps   COLON SURGERY     COLONOSCOPY     EYE SURGERY     goiters removed     x 2 at age 55, age 5   HYSTEROSCOPY WITH D & C N/A 05/06/2015   Procedure: DILATATION AND CURETTAGE /HYSTEROSCOPY;  Surgeon: Shea Evans, MD;  Location: [facility];  Service: Gynecology;  Laterality: N/A;   WISDOM TOOTH EXTRACTION      Current Meds  Medication Sig   acetaminophen (TYLENOL) 500 MG tablet Take 500 mg by mouth as needed.    acetaminophen-codeine (TYLENOL #3) 300-30 MG tablet Take 1 tablet by mouth 2 (two) times daily as needed for moderate pain.   aspirin 81 MG chewable tablet Chew 81 mg by mouth daily.   calcitonin, salmon, (MIACALCIN/FORTICAL) 200 UNIT/ACT nasal spray Place 1 spray into alternate nostrils daily.    calcitRIOL (ROCALTROL) 0.25 MCG capsule Take 0.25-0.5 mcg by mouth See admin instructions. Patient alternates between 0.29mcg and 0.38mcg daily   calcium carbonate 200 MG capsule Take 600 mg by mouth daily.    ciprofloxacin (CIPRO) 500 MG tablet Take 500 mg by mouth 3 (three) times a week.   CODEINE SULFATE PO Take by mouth. With peppermint oil 7.5mg /ml 4 times per day   estradiol (CLIMARA - DOSED IN MG/24 HR) 0.025 mg/24hr patch Place 0.025 mg onto the skin once a week.   felodipine (PLENDIL) 5 MG 24 hr tablet Take 2.5 tablets by mouth at bedtime.    Glucosamine-Chondroitin 750-600 MG CHEW Chew by mouth.   LORazepam (ATIVAN) 1 MG tablet Take 0.25-1 tablets by mouth 3 (three) times daily. 1 tab mid-morning, 1 tab in the afternoon and 1/2 tab at bedtime as needed for anxiety   MELATONIN PO Take 3 mg by mouth daily.   metroNIDAZOLE (  METROGEL) 0.75 % vaginal gel Place 1 Applicatorful vaginally See admin instructions. 5 times per week   Multiple Vitamin (MULTIVITAMIN WITH MINERALS) TABS tablet Take 1 tablet by mouth daily.   Multiple Vitamins-Minerals (PRESERVISION AREDS 2 PO) Take by mouth 2 (two) times daily.   Multiple Vitamins-Minerals (ZINC PO) Take 5 mg by mouth daily.   nebivolol (BYSTOLIC) 2.5 MG tablet Take 1 tablet (2.5 mg total) by mouth daily.   Omega-3 Fatty Acids (FISH OIL OMEGA-3 PO) Take 300 mg by mouth daily.   oxyCODONE-acetaminophen (PERCOCET) 5-325 MG tablet Take 1 tablet by mouth daily as needed for severe pain.   potassium chloride SA (K-DUR,KLOR-CON) 20 MEQ tablet Take 1 tablet by mouth every evening.    pravastatin (PRAVACHOL) 20 MG tablet Take 1 tablet by mouth every evening.     PRESCRIPTION MEDICATION Place 1 suppository rectally See admin instructions. Metronidazole 125mg  Suppository: 1 suppository per rectum 5 times per week.   progesterone (PROMETRIUM) 100 MG capsule Take 100 mg by mouth at bedtime.   Sertraline HCl (ZOLOFT PO) Take 12.5 mg by mouth daily.   SYNTHROID 75 MCG tablet Take 1 tablet by mouth daily. 1 tablet Monday - Saturday, 1.5 tab on sunday   terconazole (TERAZOL 7) 0.4 % vaginal cream Place 1 applicator vaginally once a week.    Turmeric Curcumin 500 MG CAPS Take 1 capsule by mouth daily.   vitamin B-12 (CYANOCOBALAMIN) 1000 MCG tablet Take 1,000 mcg by mouth daily.   VYVANSE 40 MG capsule Take 40 mg by mouth daily.   zolpidem (AMBIEN) 5 MG tablet Take 1 tablet by mouth at bedtime as needed. sleep    Allergies  Allergen Reactions   Mercaptopurine     Other reaction(s): Other (See Comments) Kidney failure    Metronidazole     Cannot take the tablets due to numbness in her toes. Can take active ingredient       Review of Systems negative except from HPI and PMH  Physical Exam BP 116/78   Pulse 72   Ht 5\' 1"  (1.549 m)   Wt 123 lb 12.8 oz (56.2 kg)   SpO2 97%   BMI 23.39 kg/m  Well developed and well nourished in no acute distress HENT normal E scleral and icterus clear Neck Supple JVP flat; carotids brisk and full Clear to ausculation Regular rate and rhythm, no murmurs gallops or rub Soft with active bowel sounds No clubbing cyanosis  Edema Alert and oriented, grossly normal motor and sensory function Skin Warm and Dry  ECG sinus @ 72 14/07/39   CrCl cannot be calculated (Patient's most recent lab result is older than the maximum 21 days allowed.).   Assessment and  Plan "Coming out of her skin "   Atrial tachycardia with scant palpitations   Hypertension   Syncope probably orthostatic/neurally mediated   Sinus slowing presumably associated with sleep question sleep apnea   Ulcerative colitis status post  colectomy-remotely   Significant loss   ADD   Depression and anxiety   Palpitations are much better.  She tried the 3 atenolol and nebivolol were comparable, but she likes nebivolol  Blood pressure is well-controlled.  I will defer to renal as to what they want to do about it.  Anxiety remains controlling, suggested she think about restoration place    Current medicines are reviewed at length with the patient today .  The patient does not  have concerns regarding medicines.

## 2022-01-25 NOTE — Patient Instructions (Signed)
Medication Instructions:  Your physician recommends that you continue on your current medications as directed. Please refer to the Current Medication list given to you today.  *If you need a refill on your cardiac medications before your next appointment, please call your pharmacy*   Lab Work: None ordered.  If you have labs (blood work) drawn today and your tests are completely normal, you will receive your results only by: MyChart Message (if you have MyChart) OR A paper copy in the mail If you have any lab test that is abnormal or we need to change your treatment, we will call you to review the results.   Testing/Procedures: None ordered.    Follow-Up: At [facility], you and your health needs are our priority.  As part of our continuing mission to provide you with exceptional heart care, we have created designated Provider Care Teams.  These Care Teams include your primary Cardiologist (physician) and Advanced Practice Providers (APPs -  Physician Assistants and Nurse Practitioners) who all work together to provide you with the care you need, when you need it.  We recommend signing up for the patient portal called "MyChart".  Sign up information is provided on this After Visit Summary.  MyChart is used to connect with patients for Virtual Visits (Telemedicine).  Patients are able to view lab/test results, encounter notes, upcoming appointments, etc.  Non-urgent messages can be sent to your provider as well.   To learn more about what you can do with MyChart, go to [email or web address].    Your next appointment:   12 months with Dr Klein  Important Information About Sugar       

## 2022-03-01 ENCOUNTER — Other Ambulatory Visit (HOSPITAL_COMMUNITY): Payer: Self-pay

## 2022-03-01 MED ORDER — LISDEXAMFETAMINE DIMESYLATE 40 MG PO CAPS
40.0000 mg | ORAL_CAPSULE | Freq: Every day | ORAL | 0 refills | Status: AC
  Filled 2022-03-01: qty 30, 30d supply, fill #0

## 2022-03-30 ENCOUNTER — Other Ambulatory Visit (HOSPITAL_BASED_OUTPATIENT_CLINIC_OR_DEPARTMENT_OTHER): Payer: Self-pay

## 2022-03-30 MED ORDER — LISDEXAMFETAMINE DIMESYLATE 30 MG PO CAPS
30.0000 mg | ORAL_CAPSULE | Freq: Every morning | ORAL | 0 refills | Status: DC
  Filled 2022-03-30: qty 30, 30d supply, fill #0

## 2022-04-18 ENCOUNTER — Encounter: Payer: Self-pay | Admitting: Orthopaedic Surgery

## 2022-04-18 ENCOUNTER — Ambulatory Visit (INDEPENDENT_AMBULATORY_CARE_PROVIDER_SITE_OTHER): Payer: Medicare Other | Admitting: Orthopaedic Surgery

## 2022-04-18 DIAGNOSIS — M1711 Unilateral primary osteoarthritis, right knee: Secondary | ICD-10-CM | POA: Diagnosis not present

## 2022-04-18 MED ORDER — LIDOCAINE HCL 1 % IJ SOLN
2.0000 mL | INTRAMUSCULAR | Status: AC | PRN
  Administered 2022-04-18: 2 mL

## 2022-04-18 MED ORDER — BUPIVACAINE HCL 0.5 % IJ SOLN
2.0000 mL | INTRAMUSCULAR | Status: AC | PRN
  Administered 2022-04-18: 2 mL via INTRA_ARTICULAR

## 2022-04-18 MED ORDER — METHYLPREDNISOLONE ACETATE 40 MG/ML IJ SUSP
40.0000 mg | INTRAMUSCULAR | Status: AC | PRN
  Administered 2022-04-18: 40 mg via INTRA_ARTICULAR

## 2022-04-18 NOTE — Progress Notes (Signed)
Office Visit Note   Patient: KRISZTINA EDLEMAN           Date of Birth: Nov 21, 1945           MRN: [id number] Visit Date: 04/18/2022              Requested by: Crist Infante, MD [address] [place],  [place] [address] PCP: Crist Infante, MD   Assessment & Plan: Visit Diagnoses:  1. Primary osteoarthritis of right knee     Plan: Impression is 77 year old female with right knee osteoarthritis.  Steroid injection repeated today.  She tolerated this well.  Follow-up as needed.  Follow-Up Instructions: No follow-ups on file.   Orders:  No orders of the defined types were placed in this encounter.  No orders of the defined types were placed in this encounter.     Procedures: Large Joint Inj: R knee on 04/18/2022 1:52 PM Indications: pain Details: 22 G needle  Arthrogram: No  Medications: 40 mg methylPREDNISolone acetate 40 MG/ML; 2 mL lidocaine 1 %; 2 mL bupivacaine 0.5 % Consent was given by the patient. Patient was prepped and draped in the usual sterile fashion.       Clinical Data: No additional findings.   Subjective: Chief Complaint  Patient presents with   Right Knee - Pain    HPI  Miosha returns today for follow-up on right knee osteoarthritis.  She is here for repeat steroid injection.  No complaints otherwise.  Review of Systems   Objective: Vital Signs: There were no vitals taken for this visit.  Physical Exam  Ortho Exam  Examination of right knee is unchanged.  Specialty Comments:  No specialty comments available.  Imaging: No results found.   PMFS History: Patient Active Problem List   Diagnosis Date Noted   Atrial tachycardia - with scant palpitations 11/02/2021   Syncope 11/02/2021   Primary osteoarthritis of right knee 08/15/2021   Bradycardia 08/03/2021   Hallux rigidus of left foot 01/12/2020   Pain in left foot 01/06/2016   Hallux rigidus, right foot 01/06/2016   Complex endometrial hyperplasia without atypia  05/06/2015   Past Medical History:  Diagnosis Date   ADHD (attention deficit hyperactivity disorder)    Anxiety    Arthritis    hands, feet, neck   Complex endometrial hyperplasia without atypia 05/06/2015   Depression    History of blood transfusion 2005   at [facility]   Hyperlipidemia    Hypertension    Hypothyroidism    Renal disorder    stage 3 kidney disease   Stroke [facility])    years ago, no deficits   Thyroid disease    thyroid gland removed   UC (ulcerative colitis) ([place])    and colon polyps    No family history on file.  Past Surgical History:  Procedure Laterality Date   cataract surgery Bilateral    colon removed     x 4 surgeries  Hx UC and colon polyps   COLON SURGERY     COLONOSCOPY     EYE SURGERY     goiters removed     x 2 at age 44, age 87   HYSTEROSCOPY WITH D & C N/A 05/06/2015   Procedure: DILATATION AND CURETTAGE /HYSTEROSCOPY;  Surgeon: Azucena Fallen, MD;  Location: [facility];  Service: Gynecology;  Laterality: N/A;   WISDOM TOOTH EXTRACTION     Social History   Occupational History   Not on file  Tobacco Use  Smoking status: Never   Smokeless tobacco: Never  Substance and Sexual Activity   Alcohol use: Yes    Alcohol/week: 10.0 standard drinks of alcohol    Types: 10 Glasses of wine per week    Comment: wine  6 oz wine every night   Drug use: No   Sexual activity: Yes    Birth control/protection: Post-menopausal

## 2022-05-27 DIAGNOSIS — M2021 Hallux rigidus, right foot: Secondary | ICD-10-CM

## 2022-05-27 DIAGNOSIS — M2022 Hallux rigidus, left foot: Secondary | ICD-10-CM

## 2022-05-27 NOTE — Progress Notes (Unsigned)
Office Visit Note   Patient: Cassidy Jones           Date of Birth: [birth year]           MRN: [id number] Visit Date: 05/29/2022              Requested by: Rodrigo Ran, MD [address] [place],  [place] [address] PCP: Rodrigo Ran, MD   Assessment & Plan: Visit Diagnoses:  1. Hallux rigidus, right foot   2. Hallux rigidus of left foot     Plan: Cassidy Jones underwent bilateral great toe MTP injections today.  She tolerated well.  Follow-up as needed.  Follow-Up Instructions: No follow-ups on file.   Orders:  Orders Placed This Encounter  Procedures   Small Joint Inj   No orders of the defined types were placed in this encounter.     Procedures: Small Joint Inj: bilateral great MTP on 05/27/2022 8:13 PM Details: 25 G needle, dorsal approach Medications (Right): 0.3 mL lidocaine 1 %; 0.3 mL bupivacaine 0.5 %; 13.33 mg methylPREDNISolone acetate 40 MG/ML Medications (Left): 0.3 mL lidocaine 1 %; 0.3 mL bupivacaine 0.5 %; 13.33 mg methylPREDNISolone acetate 40 MG/ML Outcome: tolerated well, no immediate complications Patient was prepped and draped in the usual sterile fashion.       Clinical Data: No additional findings.   Subjective: No chief complaint on file.   HPI  Cassidy Jones returns today for follow-up on bilateral hallux rigidus.  Requesting injections.  Review of Systems   Objective: Vital Signs: There were no vitals taken for this visit.  Physical Exam  Ortho Exam  Exam is unchanged.  Specialty Comments:  No specialty comments available.  Imaging: No results found.   PMFS History: Patient Active Problem List   Diagnosis Date Noted   Atrial tachycardia - with scant palpitations 11/02/2021   Syncope 11/02/2021   Primary osteoarthritis of right knee 08/15/2021   Bradycardia 08/03/2021   Hallux rigidus of left foot 01/12/2020   Pain in left foot 01/06/2016   Hallux rigidus, right foot 01/06/2016   Complex endometrial hyperplasia  without atypia 05/06/2015   Past Medical History:  Diagnosis Date   ADHD (attention deficit hyperactivity disorder)    Anxiety    Arthritis    hands, feet, neck   Complex endometrial hyperplasia without atypia 05/06/2015   Depression    History of blood transfusion 2005   at [facility]   Hyperlipidemia    Hypertension    Hypothyroidism    Renal disorder    stage 3 kidney disease   Stroke [facility])    years ago, no deficits   Thyroid disease    thyroid gland removed   UC (ulcerative colitis) (HCC)    and colon polyps    No family history on file.  Past Surgical History:  Procedure Laterality Date   cataract surgery Bilateral    colon removed     x 4 surgeries  Hx UC and colon polyps   COLON SURGERY     COLONOSCOPY     EYE SURGERY     goiters removed     x 2 at age 69, age 4   HYSTEROSCOPY WITH D & C N/A 05/06/2015   Procedure: DILATATION AND CURETTAGE /HYSTEROSCOPY;  Surgeon: Shea Evans, MD;  Location: [facility];  Service: Gynecology;  Laterality: N/A;   WISDOM TOOTH EXTRACTION     Social History   Occupational History   Not on file  Tobacco Use   Smoking status:  Never   Smokeless tobacco: Never  Substance and Sexual Activity   Alcohol use: Yes    Alcohol/week: 10.0 standard drinks of alcohol    Types: 10 Glasses of wine per week    Comment: wine  6 oz wine every night   Drug use: No   Sexual activity: Yes    Birth control/protection: Post-menopausal

## 2022-05-29 ENCOUNTER — Ambulatory Visit (INDEPENDENT_AMBULATORY_CARE_PROVIDER_SITE_OTHER): Payer: Medicare Other | Admitting: Orthopaedic Surgery

## 2022-05-29 ENCOUNTER — Encounter: Payer: Self-pay | Admitting: Orthopaedic Surgery

## 2022-05-29 DIAGNOSIS — M2021 Hallux rigidus, right foot: Secondary | ICD-10-CM

## 2022-05-29 DIAGNOSIS — M2022 Hallux rigidus, left foot: Secondary | ICD-10-CM | POA: Diagnosis not present

## 2022-05-29 MED ORDER — BUPIVACAINE HCL 0.5 % IJ SOLN
0.3000 mL | INTRAMUSCULAR | Status: AC | PRN
Start: 2022-05-27 — End: 2022-05-27
  Administered 2022-05-27: .3 mL via INTRA_ARTICULAR

## 2022-05-29 MED ORDER — METHYLPREDNISOLONE ACETATE 40 MG/ML IJ SUSP
13.3300 mg | INTRAMUSCULAR | Status: AC | PRN
Start: 2022-05-27 — End: 2022-05-27
  Administered 2022-05-27: 13.33 mg via INTRA_ARTICULAR

## 2022-05-29 MED ORDER — LIDOCAINE HCL 1 % IJ SOLN
0.3000 mL | INTRAMUSCULAR | Status: AC | PRN
Start: 2022-05-27 — End: 2022-05-27
  Administered 2022-05-27: .3 mL

## 2022-06-06 ENCOUNTER — Other Ambulatory Visit: Payer: Self-pay | Admitting: Internal Medicine

## 2022-06-06 DIAGNOSIS — E785 Hyperlipidemia, unspecified: Secondary | ICD-10-CM

## 2022-06-11 ENCOUNTER — Other Ambulatory Visit (HOSPITAL_COMMUNITY): Payer: Self-pay

## 2022-06-13 ENCOUNTER — Encounter (HOSPITAL_COMMUNITY): Payer: Medicare Other

## 2022-06-19 ENCOUNTER — Ambulatory Visit (HOSPITAL_COMMUNITY)
Admission: RE | Admit: 2022-06-19 | Discharge: 2022-06-19 | Disposition: A | Discharge status: Home / Self Care | Payer: Medicare Other | Source: Ambulatory Visit | Attending: Internal Medicine | Admitting: Internal Medicine

## 2022-06-19 DIAGNOSIS — M810 Age-related osteoporosis without current pathological fracture: Secondary | ICD-10-CM | POA: Diagnosis not present

## 2022-06-19 MED ORDER — DENOSUMAB 60 MG/ML ~~LOC~~ SOSY
60.0000 mg | PREFILLED_SYRINGE | Freq: Once | SUBCUTANEOUS | Status: AC
  Administered 2022-06-19: 60 mg via SUBCUTANEOUS

## 2022-06-19 MED ORDER — DENOSUMAB 60 MG/ML ~~LOC~~ SOSY
PREFILLED_SYRINGE | SUBCUTANEOUS | Status: AC
  Filled 2022-06-19: qty 1

## 2022-07-10 ENCOUNTER — Ambulatory Visit
Admission: RE | Admit: 2022-07-10 | Discharge: 2022-07-10 | Disposition: A | Discharge status: Home / Self Care | Payer: No Typology Code available for payment source | Source: Ambulatory Visit | Attending: Internal Medicine | Admitting: Internal Medicine

## 2022-07-10 DIAGNOSIS — E785 Hyperlipidemia, unspecified: Secondary | ICD-10-CM

## 2022-08-21 ENCOUNTER — Ambulatory Visit: Payer: Medicare Other | Admitting: Orthopaedic Surgery

## 2022-08-21 DIAGNOSIS — M1711 Unilateral primary osteoarthritis, right knee: Secondary | ICD-10-CM | POA: Diagnosis not present

## 2022-08-21 MED ORDER — BUPIVACAINE HCL 0.5 % IJ SOLN
2.0000 mL | INTRAMUSCULAR | Status: AC | PRN
  Administered 2022-08-21: 2 mL via INTRA_ARTICULAR

## 2022-08-21 MED ORDER — LIDOCAINE HCL 1 % IJ SOLN
2.0000 mL | INTRAMUSCULAR | Status: AC | PRN
Start: 2022-08-21 — End: 2022-08-21
  Administered 2022-08-21: 2 mL

## 2022-08-21 MED ORDER — METHYLPREDNISOLONE ACETATE 40 MG/ML IJ SUSP
40.0000 mg | INTRAMUSCULAR | Status: AC | PRN
Start: 2022-08-21 — End: 2022-08-21
  Administered 2022-08-21: 40 mg via INTRA_ARTICULAR

## 2022-08-21 NOTE — Progress Notes (Signed)
Office Visit Note   Patient: Cassidy Jones           Date of Birth: 1945-03-30           MRN: [id number] Visit Date: 08/21/2022              Requested by: Rodrigo Ran, MD [address] [place],  [place] [address] PCP: Rodrigo Ran, MD   Assessment & Plan: Visit Diagnoses:  1. Primary osteoarthritis of right knee     Plan: Impression is 77 year old female with right knee osteoarthritis.  Cortisone injection repeated today.  Follow-up as needed.  Follow-Up Instructions: No follow-ups on file.   Orders:  No orders of the defined types were placed in this encounter.  No orders of the defined types were placed in this encounter.     Procedures: Large Joint Inj: R knee on 08/21/2022 3:52 PM Indications: pain Details: 22 G needle  Arthrogram: No  Medications: 40 mg methylPREDNISolone acetate 40 MG/ML; 2 mL lidocaine 1 %; 2 mL bupivacaine 0.5 % Consent was given by the patient. Patient was prepped and draped in the usual sterile fashion.       Clinical Data: No additional findings.   Subjective: Chief Complaint  Patient presents with   Right Knee - Pain    HPI Madi returns today for right knee osteoarthritis.  Requesting repeat cortisone injection. Review of Systems   Objective: Vital Signs: There were no vitals taken for this visit.  Physical Exam  Ortho Exam Examination right knee is unchanged. Specialty Comments:  No specialty comments available.  Imaging: No results found.   PMFS History: Patient Active Problem List   Diagnosis Date Noted   Atrial tachycardia - with scant palpitations 11/02/2021   Syncope 11/02/2021   Primary osteoarthritis of right knee 08/15/2021   Bradycardia 08/03/2021   Hallux rigidus of left foot 01/12/2020   Pain in left foot 01/06/2016   Hallux rigidus, right foot 01/06/2016   Complex endometrial hyperplasia without atypia 05/06/2015   Past Medical History:  Diagnosis Date   ADHD (attention deficit  hyperactivity disorder)    Anxiety    Arthritis    hands, feet, neck   Complex endometrial hyperplasia without atypia 05/06/2015   Depression    History of blood transfusion 2005   at [facility]   Hyperlipidemia    Hypertension    Hypothyroidism    Renal disorder    stage 3 kidney disease   Stroke [facility])    years ago, no deficits   Thyroid disease    thyroid gland removed   UC (ulcerative colitis) (HCC)    and colon polyps    No family history on file.  Past Surgical History:  Procedure Laterality Date   cataract surgery Bilateral    colon removed     x 4 surgeries  Hx UC and colon polyps   COLON SURGERY     COLONOSCOPY     EYE SURGERY     goiters removed     x 2 at age 11, age 15   HYSTEROSCOPY WITH D & C N/A 05/06/2015   Procedure: DILATATION AND CURETTAGE /HYSTEROSCOPY;  Surgeon: Shea Evans, MD;  Location: [facility];  Service: Gynecology;  Laterality: N/A;   WISDOM TOOTH EXTRACTION     Social History   Occupational History   Not on file  Tobacco Use   Smoking status: Never   Smokeless tobacco: Never  Substance and Sexual Activity   Alcohol use: Yes  Alcohol/week: 10.0 standard drinks of alcohol    Types: 10 Glasses of wine per week    Comment: wine  6 oz wine every night   Drug use: No   Sexual activity: Yes    Birth control/protection: Post-menopausal

## 2022-10-07 ENCOUNTER — Other Ambulatory Visit: Payer: Self-pay | Admitting: Internal Medicine

## 2022-11-07 ENCOUNTER — Other Ambulatory Visit (HOSPITAL_COMMUNITY): Payer: Self-pay

## 2022-11-07 MED ORDER — LISDEXAMFETAMINE DIMESYLATE 40 MG PO CAPS
40.0000 mg | ORAL_CAPSULE | Freq: Every morning | ORAL | 0 refills | Status: DC
  Filled 2022-11-07: qty 30, 30d supply, fill #0

## 2022-11-08 ENCOUNTER — Other Ambulatory Visit (HOSPITAL_COMMUNITY): Payer: Self-pay

## 2022-12-04 ENCOUNTER — Encounter: Payer: Self-pay | Admitting: Orthopaedic Surgery

## 2022-12-04 ENCOUNTER — Ambulatory Visit (INDEPENDENT_AMBULATORY_CARE_PROVIDER_SITE_OTHER): Payer: Medicare Other | Admitting: Orthopaedic Surgery

## 2022-12-04 DIAGNOSIS — M2022 Hallux rigidus, left foot: Secondary | ICD-10-CM

## 2022-12-04 DIAGNOSIS — M2021 Hallux rigidus, right foot: Secondary | ICD-10-CM

## 2022-12-04 NOTE — Progress Notes (Signed)
Office Visit Note   Patient: MONDA BELVEAL           Date of Birth: 26-May-1945           MRN: [id number] Visit Date: 12/04/2022              Requested by: Rodrigo Ran, MD [address] [place],  [place] [address] PCP: Rodrigo Ran, MD   Assessment & Plan: Visit Diagnoses:  1. Hallux rigidus, right foot   2. Hallux rigidus of left foot     Plan: Elease Hashimoto underwent bilateral great toe MTP injections.  She tolerated this well.  Follow-up as needed.  Follow-Up Instructions: No follow-ups on file.   Orders:  No orders of the defined types were placed in this encounter.  No orders of the defined types were placed in this encounter.     Procedures: No procedures performed   Clinical Data: No additional findings.   Subjective: Chief Complaint  Patient presents with   Left Foot - Follow-up   Right Foot - Follow-up    HPI Raelen comes back today for follow-up for bilateral great toe osteoarthritis. Review of Systems   Objective: Vital Signs: There were no vitals taken for this visit.  Physical Exam  Ortho Exam Exams of both toes are unchanged from prior visit. Specialty Comments:  No specialty comments available.  Imaging: No results found.   PMFS History: Patient Active Problem List   Diagnosis Date Noted   Atrial tachycardia - with scant palpitations 11/02/2021   Syncope 11/02/2021   Primary osteoarthritis of right knee 08/15/2021   Bradycardia 08/03/2021   Hallux rigidus of left foot 01/12/2020   Pain in left foot 01/06/2016   Hallux rigidus, right foot 01/06/2016   Complex endometrial hyperplasia without atypia 05/06/2015   Past Medical History:  Diagnosis Date   ADHD (attention deficit hyperactivity disorder)    Anxiety    Arthritis    hands, feet, neck   Complex endometrial hyperplasia without atypia 05/06/2015   Depression    History of blood transfusion 2005   at [facility]   Hyperlipidemia    Hypertension    Hypothyroidism     Renal disorder    stage 3 kidney disease   Stroke [facility])    years ago, no deficits   Thyroid disease    thyroid gland removed   UC (ulcerative colitis) (HCC)    and colon polyps    No family history on file.  Past Surgical History:  Procedure Laterality Date   cataract surgery Bilateral    colon removed     x 4 surgeries  Hx UC and colon polyps   COLON SURGERY     COLONOSCOPY     EYE SURGERY     goiters removed     x 2 at age 77, age 77   HYSTEROSCOPY WITH D & C N/A 05/06/2015   Procedure: DILATATION AND CURETTAGE /HYSTEROSCOPY;  Surgeon: Shea Evans, MD;  Location: [facility];  Service: Gynecology;  Laterality: N/A;   WISDOM TOOTH EXTRACTION     Social History   Occupational History   Not on file  Tobacco Use   Smoking status: Never   Smokeless tobacco: Never  Substance and Sexual Activity   Alcohol use: Yes    Alcohol/week: 10.0 standard drinks of alcohol    Types: 10 Glasses of wine per week    Comment: wine  6 oz wine every night   Drug use: No   Sexual activity:  Yes    Birth control/protection: Post-menopausal

## 2022-12-07 ENCOUNTER — Other Ambulatory Visit (HOSPITAL_COMMUNITY): Payer: Self-pay

## 2022-12-07 MED ORDER — LISDEXAMFETAMINE DIMESYLATE 40 MG PO CAPS
40.0000 mg | ORAL_CAPSULE | Freq: Every morning | ORAL | 0 refills | Status: DC
  Filled 2022-12-07: qty 30, 30d supply, fill #0

## 2022-12-08 ENCOUNTER — Other Ambulatory Visit (HOSPITAL_COMMUNITY): Payer: Self-pay

## 2022-12-24 ENCOUNTER — Other Ambulatory Visit (HOSPITAL_COMMUNITY): Payer: Self-pay | Admitting: *Deleted

## 2022-12-25 ENCOUNTER — Ambulatory Visit (HOSPITAL_COMMUNITY)
Admission: RE | Admit: 2022-12-25 | Discharge: 2022-12-25 | Disposition: A | Discharge status: Home / Self Care | Payer: Medicare Other | Source: Ambulatory Visit | Attending: Internal Medicine | Admitting: Internal Medicine

## 2022-12-25 ENCOUNTER — Ambulatory Visit (INDEPENDENT_AMBULATORY_CARE_PROVIDER_SITE_OTHER): Payer: Medicare Other | Admitting: Orthopaedic Surgery

## 2022-12-25 DIAGNOSIS — M810 Age-related osteoporosis without current pathological fracture: Secondary | ICD-10-CM | POA: Insufficient documentation

## 2022-12-25 DIAGNOSIS — M1711 Unilateral primary osteoarthritis, right knee: Secondary | ICD-10-CM | POA: Diagnosis not present

## 2022-12-25 MED ORDER — DENOSUMAB 60 MG/ML ~~LOC~~ SOSY: 60.0000 mg | PREFILLED_SYRINGE | Freq: Once | SUBCUTANEOUS | Status: AC

## 2022-12-25 MED ORDER — DENOSUMAB 60 MG/ML ~~LOC~~ SOSY
PREFILLED_SYRINGE | SUBCUTANEOUS | Status: AC
  Administered 2022-12-25: 60 mg via SUBCUTANEOUS
  Filled 2022-12-25: qty 1

## 2022-12-25 MED ORDER — METHYLPREDNISOLONE ACETATE 40 MG/ML IJ SUSP
40.0000 mg | INTRAMUSCULAR | Status: AC | PRN
Start: 2022-12-25 — End: 2022-12-25
  Administered 2022-12-25: 40 mg via INTRA_ARTICULAR

## 2022-12-25 MED ORDER — BUPIVACAINE HCL 0.5 % IJ SOLN
2.0000 mL | INTRAMUSCULAR | Status: AC | PRN
Start: 2022-12-25 — End: 2022-12-25
  Administered 2022-12-25: 2 mL via INTRA_ARTICULAR

## 2022-12-25 MED ORDER — LIDOCAINE HCL 1 % IJ SOLN
2.0000 mL | INTRAMUSCULAR | Status: AC | PRN
Start: 2022-12-25 — End: 2022-12-25
  Administered 2022-12-25: 2 mL

## 2022-12-25 NOTE — Progress Notes (Signed)
Office Visit Note   Patient: Cassidy Jones           Date of Birth: July 09, 1945           MRN: [id number] Visit Date: 12/25/2022              Requested by: Rodrigo Ran, MD [address] [place],  [place] [address] PCP: Rodrigo Ran, MD   Assessment & Plan: Visit Diagnoses:  1. Primary osteoarthritis of right knee     Plan: Alliana is a 77 year old female with right knee osteoarthritis.  Cortisone injection repeated today.  Will see her back as needed.  Follow-Up Instructions: No follow-ups on file.   Orders:  No orders of the defined types were placed in this encounter.  No orders of the defined types were placed in this encounter.     Procedures: Large Joint Inj: R knee on 12/25/2022 4:36 PM Indications: pain Details: 22 G needle  Arthrogram: No  Medications: 40 mg methylPREDNISolone acetate 40 MG/ML; 2 mL lidocaine 1 %; 2 mL bupivacaine 0.5 % Consent was given by the patient. Patient was prepped and draped in the usual sterile fashion.       Clinical Data: No additional findings.   Subjective: Chief Complaint  Patient presents with   Right Knee - Pain    08/21/22 RT KNEE CORTISONE INJECTION    HPI Cassidy Jones returns today for follow-up evaluation of right knee osteoarthritis.  Her last cortisone injection was 4 months ago.  Her symptoms have returned. Review of Systems   Objective: Vital Signs: There were no vitals taken for this visit.  Physical Exam  Ortho Exam Exam of the right knee is unchanged from prior visit. Specialty Comments:  No specialty comments available.  Imaging: No results found.   PMFS History: Patient Active Problem List   Diagnosis Date Noted   Atrial tachycardia - with scant palpitations 11/02/2021   Syncope 11/02/2021   Primary osteoarthritis of right knee 08/15/2021   Bradycardia 08/03/2021   Hallux rigidus of left foot 01/12/2020   Pain in left foot 01/06/2016   Hallux rigidus, right foot 01/06/2016    Complex endometrial hyperplasia without atypia 05/06/2015   Past Medical History:  Diagnosis Date   ADHD (attention deficit hyperactivity disorder)    Anxiety    Arthritis    hands, feet, neck   Complex endometrial hyperplasia without atypia 05/06/2015   Depression    History of blood transfusion 2005   at [facility]   Hyperlipidemia    Hypertension    Hypothyroidism    Renal disorder    stage 3 kidney disease   Stroke [facility])    years ago, no deficits   Thyroid disease    thyroid gland removed   UC (ulcerative colitis) (HCC)    and colon polyps    No family history on file.  Past Surgical History:  Procedure Laterality Date   cataract surgery Bilateral    colon removed     x 4 surgeries  Hx UC and colon polyps   COLON SURGERY     COLONOSCOPY     EYE SURGERY     goiters removed     x 2 at age 30, age 49   HYSTEROSCOPY WITH D & C N/A 05/06/2015   Procedure: DILATATION AND CURETTAGE /HYSTEROSCOPY;  Surgeon: Shea Evans, MD;  Location: [facility];  Service: Gynecology;  Laterality: N/A;   WISDOM TOOTH EXTRACTION     Social History   Occupational History  Not on file  Tobacco Use   Smoking status: Never   Smokeless tobacco: Never  Substance and Sexual Activity   Alcohol use: Yes    Alcohol/week: 10.0 standard drinks of alcohol    Types: 10 Glasses of wine per week    Comment: wine  6 oz wine every night   Drug use: No   Sexual activity: Yes    Birth control/protection: Post-menopausal

## 2023-01-07 ENCOUNTER — Other Ambulatory Visit: Payer: Self-pay | Admitting: Internal Medicine

## 2023-01-08 ENCOUNTER — Other Ambulatory Visit (HOSPITAL_COMMUNITY): Payer: Self-pay

## 2023-01-08 MED ORDER — LISDEXAMFETAMINE DIMESYLATE 40 MG PO CAPS
40.0000 mg | ORAL_CAPSULE | Freq: Every morning | ORAL | 0 refills | Status: DC
  Filled 2023-01-08: qty 30, 30d supply, fill #0

## 2023-01-10 ENCOUNTER — Other Ambulatory Visit: Payer: Self-pay

## 2023-01-10 MED ORDER — NEBIVOLOL HCL 2.5 MG PO TABS: 2.5000 mg | ORAL_TABLET | Freq: Every day | ORAL | 0 refills | Status: DC

## 2023-01-20 NOTE — Progress Notes (Unsigned)
Cardiology Office Note:  .   Date:  01/20/2023  ID:  Cassidy Jones, DOB 1945-08-07, MRN [id number] PCP: Rodrigo Ran, MD  [facility] Providers Cardiologist:  Lesleigh Noe, MD (Inactive) {  History of Present Illness: .   Cassidy Jones is a 77 y.o. 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 Graciela Husbands 01/25/22, anxious/anxiety intermittently limiting her life, better of late though still problematic, BB had helped fast rates and symptoms > she felt bst with nebivolol. Hx of syncope likely orthostatic/neurally medicated Discussed sinus slowing ? Sleep apnea No med changes made  Today's visit is scheduled as an annual visit  ROS:   All in all she is doing well Plenty of arthritic/musculoskeletal aches/pains No CP, palpitations or cardiac awareness She has noted when 1st in bed or laying down for a nap, she is breathing a little fast No observed or perceived SOB otherwise, no DOE No near syncope or syncope. Chronic GI issues are at her baseline, though after years finally on a successful maintenance regime of antibiotic/antifungal regime  Arrhythmia/AAD hx Atach via monitor May 2023 No AAD to date  Studies Reviewed: Marland Kitchen    EKG done today and reviewed by myself:  SR, 86bpm, no significant change from last  06/30/2021: TTE  1. Left ventricular ejection fraction, by estimation, is 60 to 65%. The  left ventricle has normal function. The left ventricle has no regional  wall motion abnormalities. Left ventricular diastolic parameters are  consistent with Grade I diastolic  dysfunction (impaired relaxation).   2. Right ventricular systolic function is normal. The right ventricular  size is normal. There is normal pulmonary artery systolic pressure. The  estimated right ventricular systolic pressure is 23.4 mmHg.   3. The mitral valve is grossly  normal. No evidence of mitral valve  regurgitation. No evidence of mitral stenosis.   4. The aortic valve is grossly normal. Aortic valve regurgitation is not  visualized. No aortic stenosis is present.   5. The inferior vena cava is normal in size with greater than 50%  respiratory variability, suggesting right atrial pressure of 3 mmHg.   May 2023 monitor The basic rhythm is normal sinus rhythm with an average heart rate of 78 bpm. Heart rate range 49 to 214 bpm. 7 nonsustained SVT episodes with fastest rate 214 bpm. Longest episode lasting 14 beats at 105 bpm. 2 episodes of significant bradycardia with 1 pause lasting 4.2 seconds on 06/15/2021 at 8:26 PM. Need to correlate with patient's diary. Infrequent PACs, PVCs, and nonsustained arrhythmia, all less than 1%. No reported symptoms during the study.   Risk Assessment/Calculations:    Physical Exam:   VS:  There were no vitals taken for this visit.   Wt Readings from Last 3 Encounters:  01/25/22 123 lb 12.8 oz (56.2 kg)  08/03/21 121 lb 9.6 oz (55.2 kg)  06/12/21 120 lb 9.6 oz (54.7 kg)    GEN: Well nourished, well developed in no acute distress NECK: No JVD; No carotid bruits CARDIAC: RRR, no murmurs, rubs, gallops RESPIRATORY:  CTA b/l without rales, wheezing or rhonchi  ABDOMEN: Soft, non-tender, non-distended EXTREMITIES: No edema; No deformity   ASSESSMENT AND PLAN: .    ATach Well controlled on low dose BB  HTN Typically better, has been higher the last couple days with a family crisis that she has been worried about  Syncope Felt to  be orthostatic/neurally mediated Not recurrent    Dispo: continue annually visits, sooner if needed  Signed, Sheilah Pigeon, PA-C

## 2023-01-22 ENCOUNTER — Ambulatory Visit: Payer: Medicare Other | Attending: Physician Assistant | Admitting: Physician Assistant

## 2023-01-22 ENCOUNTER — Encounter: Payer: Self-pay | Admitting: Physician Assistant

## 2023-01-22 VITALS — BP 142/78 | HR 90 | Ht 61.0 in | Wt 122.6 lb

## 2023-01-22 DIAGNOSIS — I4719 Other supraventricular tachycardia: Secondary | ICD-10-CM | POA: Diagnosis not present

## 2023-01-22 DIAGNOSIS — I10 Essential (primary) hypertension: Secondary | ICD-10-CM | POA: Insufficient documentation

## 2023-01-22 MED ORDER — NEBIVOLOL HCL 2.5 MG PO TABS: 2.5000 mg | ORAL_TABLET | Freq: Every day | ORAL | 3 refills | Status: DC

## 2023-01-22 NOTE — Patient Instructions (Signed)
Medication Instructions:   Your physician recommends that you continue on your current medications as directed. Please refer to the Current Medication list given to you today.   *If you need a refill on your cardiac medications before your next appointment, please call your pharmacy*   Lab Work:  NONE ORDERED  TODAY    If you have labs (blood work) drawn today and your tests are completely normal, you will receive your results only by: MyChart Message (if you have MyChart) OR A paper copy in the mail If you have any lab test that is abnormal or we need to change your treatment, we will call you to review the results.   Testing/Procedures: NONE ORDERED  TODAY    Follow-Up:  At [facility], you and your health needs are our priority.  As part of our continuing mission to provide you with exceptional heart care, we have created designated Provider Care Teams.  These Care Teams include your primary Cardiologist (physician) and Advanced Practice Providers (APPs -  Physician Assistants and Nurse Practitioners) who all work together to provide you with the care you need, when you need it.  We recommend signing up for the patient portal called "MyChart".  Sign up information is provided on this After Visit Summary.  MyChart is used to connect with patients for Virtual Visits (Telemedicine).  Patients are able to view lab/test results, encounter notes, upcoming appointments, etc.  Non-urgent messages can be sent to your provider as well.   To learn more about what you can do with MyChart, go to [email or web address].    Your next appointment:    1 year(s)  Provider:   You may see    Francis Dowse, PA-C   Other Instructions

## 2023-02-07 ENCOUNTER — Other Ambulatory Visit (HOSPITAL_COMMUNITY): Payer: Self-pay

## 2023-02-07 MED ORDER — LISDEXAMFETAMINE DIMESYLATE 40 MG PO CAPS
40.0000 mg | ORAL_CAPSULE | Freq: Every morning | ORAL | 0 refills | Status: DC
  Filled 2023-02-07: qty 30, 30d supply, fill #0

## 2023-02-08 ENCOUNTER — Other Ambulatory Visit (HOSPITAL_COMMUNITY): Payer: Self-pay

## 2023-03-08 ENCOUNTER — Other Ambulatory Visit (HOSPITAL_COMMUNITY): Payer: Self-pay

## 2023-03-08 ENCOUNTER — Other Ambulatory Visit: Payer: Self-pay

## 2023-03-08 MED ORDER — LISDEXAMFETAMINE DIMESYLATE 40 MG PO CAPS
40.0000 mg | ORAL_CAPSULE | Freq: Every day | ORAL | 0 refills | Status: DC
  Filled 2023-03-08: qty 30, 30d supply, fill #0

## 2023-04-09 ENCOUNTER — Other Ambulatory Visit (HOSPITAL_COMMUNITY): Payer: Self-pay

## 2023-04-09 MED ORDER — LISDEXAMFETAMINE DIMESYLATE 40 MG PO CAPS
40.0000 mg | ORAL_CAPSULE | Freq: Every morning | ORAL | 0 refills | Status: DC
  Filled 2023-04-09: qty 30, 30d supply, fill #0

## 2023-04-30 ENCOUNTER — Ambulatory Visit (INDEPENDENT_AMBULATORY_CARE_PROVIDER_SITE_OTHER): Admitting: Orthopaedic Surgery

## 2023-04-30 ENCOUNTER — Telehealth: Payer: Self-pay

## 2023-04-30 DIAGNOSIS — M1711 Unilateral primary osteoarthritis, right knee: Secondary | ICD-10-CM

## 2023-04-30 MED ORDER — METHYLPREDNISOLONE ACETATE 40 MG/ML IJ SUSP
40.0000 mg | INTRAMUSCULAR | Status: AC | PRN
Start: 2023-04-30 — End: 2023-04-30
  Administered 2023-04-30: 40 mg via INTRA_ARTICULAR

## 2023-04-30 MED ORDER — LIDOCAINE HCL 1 % IJ SOLN
2.0000 mL | INTRAMUSCULAR | Status: AC | PRN
Start: 2023-04-30 — End: 2023-04-30
  Administered 2023-04-30: 2 mL

## 2023-04-30 MED ORDER — BUPIVACAINE HCL 0.5 % IJ SOLN
2.0000 mL | INTRAMUSCULAR | Status: AC | PRN
Start: 2023-04-30 — End: 2023-04-30
  Administered 2023-04-30: 2 mL via INTRA_ARTICULAR

## 2023-04-30 NOTE — Telephone Encounter (Signed)
Please precert for right knee gel injection. Dr.Xu's patient. Thanks!  

## 2023-04-30 NOTE — Progress Notes (Signed)
 Office Visit Note   Patient: Cassidy Jones           Date of Birth: 1945/12/25           MRN: [id number] Visit Date: 04/30/2023              Requested by: Rodrigo Ran, MD [address] [place],  [place] [address] PCP: Rodrigo Ran, MD   Assessment & Plan: Visit Diagnoses:  1. Primary osteoarthritis of right knee     Plan: Cassidy Jones is a 78 year old female with right knee osteoarthritis.  Cortisone injection repeated.  Will get Visco approval as well.  This patient is diagnosed with osteoarthritis of the knee(s).    Radiographs show evidence of joint space narrowing, osteophytes, subchondral sclerosis and/or subchondral cysts.  This patient has knee pain which interferes with functional and activities of daily living.    This patient has experienced inadequate response, adverse effects and/or intolerance with conservative treatments such as acetaminophen, NSAIDS, topical creams, physical therapy or regular exercise, knee bracing and/or weight loss.   This patient has experienced inadequate response or has a contraindication to intra articular steroid injections for at least 3 months.   This patient is not scheduled to have a total knee replacement within 6 months of starting treatment with viscosupplementation.  Follow-Up Instructions: No follow-ups on file.   Orders:  No orders of the defined types were placed in this encounter.  No orders of the defined types were placed in this encounter.     Procedures: Large Joint Inj: R knee on 04/30/2023 3:11 PM Indications: pain Details: 22 G needle  Arthrogram: No  Medications: 40 mg methylPREDNISolone acetate 40 MG/ML; 2 mL lidocaine 1 %; 2 mL bupivacaine 0.5 % Consent was given by the patient. Patient was prepped and draped in the usual sterile fashion.       Clinical Data: No additional findings.   Subjective: Chief Complaint  Patient presents with   Right Knee - Follow-up    HPI Cassidy Jones comes in today  for follow-up evaluation of right knee osteoarthritis. Review of Systems   Objective: Vital Signs: There were no vitals taken for this visit.  Physical Exam  Ortho Exam Exam of the right knee is unchanged from prior visit. Specialty Comments:  No specialty comments available.  Imaging: No results found.   PMFS History: Patient Active Problem List   Diagnosis Date Noted   Atrial tachycardia - with scant palpitations 11/02/2021   Syncope 11/02/2021   Primary osteoarthritis of right knee 08/15/2021   Bradycardia 08/03/2021   Hallux rigidus of left foot 01/12/2020   Pain in left foot 01/06/2016   Hallux rigidus, right foot 01/06/2016   Complex endometrial hyperplasia without atypia 05/06/2015   Past Medical History:  Diagnosis Date   ADHD (attention deficit hyperactivity disorder)    Anxiety    Arthritis    hands, feet, neck   Complex endometrial hyperplasia without atypia 05/06/2015   Depression    History of blood transfusion 2005   at [facility]   Hyperlipidemia    Hypertension    Hypothyroidism    Renal disorder    stage 3 kidney disease   Stroke [facility])    years ago, no deficits   Thyroid disease    thyroid gland removed   UC (ulcerative colitis) (HCC)    and colon polyps    No family history on file.  Past Surgical History:  Procedure Laterality Date   cataract surgery Bilateral  colon removed     x 4 surgeries  Hx UC and colon polyps   COLON SURGERY     COLONOSCOPY     EYE SURGERY     goiters removed     x 2 at age 28, age 26   HYSTEROSCOPY WITH D & C N/A 05/06/2015   Procedure: DILATATION AND CURETTAGE /HYSTEROSCOPY;  Surgeon: Shea Evans, MD;  Location: [facility];  Service: Gynecology;  Laterality: N/A;   WISDOM TOOTH EXTRACTION     Social History   Occupational History   Not on file  Tobacco Use   Smoking status: Never   Smokeless tobacco: Never  Substance and Sexual Activity   Alcohol use: Yes    Alcohol/week: 10.0 standard drinks of  alcohol    Types: 10 Glasses of wine per week    Comment: wine  6 oz wine every night   Drug use: No   Sexual activity: Yes    Birth control/protection: Post-menopausal

## 2023-05-09 ENCOUNTER — Other Ambulatory Visit (HOSPITAL_COMMUNITY): Payer: Self-pay

## 2023-05-09 MED ORDER — LISDEXAMFETAMINE DIMESYLATE 40 MG PO CAPS
40.0000 mg | ORAL_CAPSULE | Freq: Every morning | ORAL | 0 refills | Status: DC
  Filled 2023-05-09: qty 30, 30d supply, fill #0

## 2023-05-17 NOTE — Telephone Encounter (Signed)
 VOB submitted for Monovisc, right knee

## 2023-06-06 ENCOUNTER — Other Ambulatory Visit (HOSPITAL_COMMUNITY): Payer: Self-pay

## 2023-06-06 MED ORDER — LISDEXAMFETAMINE DIMESYLATE 40 MG PO CAPS
40.0000 mg | ORAL_CAPSULE | Freq: Every morning | ORAL | 0 refills | Status: DC
  Filled 2023-06-07: qty 30, 30d supply, fill #0

## 2023-06-07 ENCOUNTER — Other Ambulatory Visit (HOSPITAL_COMMUNITY): Payer: Self-pay

## 2023-06-07 ENCOUNTER — Other Ambulatory Visit: Payer: Self-pay

## 2023-06-26 ENCOUNTER — Other Ambulatory Visit (HOSPITAL_COMMUNITY): Payer: Self-pay | Admitting: *Deleted

## 2023-06-27 ENCOUNTER — Ambulatory Visit (HOSPITAL_COMMUNITY)
Admission: RE | Admit: 2023-06-27 | Discharge: 2023-06-27 | Disposition: A | Discharge status: Home / Self Care | Source: Ambulatory Visit | Attending: Internal Medicine | Admitting: Internal Medicine

## 2023-06-27 DIAGNOSIS — M810 Age-related osteoporosis without current pathological fracture: Secondary | ICD-10-CM | POA: Insufficient documentation

## 2023-06-27 MED ORDER — DENOSUMAB 60 MG/ML ~~LOC~~ SOSY
PREFILLED_SYRINGE | SUBCUTANEOUS | Status: AC
  Filled 2023-06-27: qty 1

## 2023-06-27 MED ORDER — DENOSUMAB 60 MG/ML ~~LOC~~ SOSY
60.0000 mg | PREFILLED_SYRINGE | Freq: Once | SUBCUTANEOUS | Status: AC
  Administered 2023-06-27: 60 mg via SUBCUTANEOUS

## 2023-07-08 ENCOUNTER — Other Ambulatory Visit (HOSPITAL_COMMUNITY): Payer: Self-pay

## 2023-07-08 MED ORDER — LISDEXAMFETAMINE DIMESYLATE 40 MG PO CAPS
40.0000 mg | ORAL_CAPSULE | Freq: Every morning | ORAL | 0 refills | Status: DC
  Filled 2023-07-08: qty 30, 30d supply, fill #0

## 2023-07-09 ENCOUNTER — Ambulatory Visit (INDEPENDENT_AMBULATORY_CARE_PROVIDER_SITE_OTHER): Admitting: Orthopaedic Surgery

## 2023-07-09 ENCOUNTER — Encounter: Payer: Self-pay | Admitting: Orthopaedic Surgery

## 2023-07-09 DIAGNOSIS — M2021 Hallux rigidus, right foot: Secondary | ICD-10-CM

## 2023-07-09 MED ORDER — METHYLPREDNISOLONE ACETATE 40 MG/ML IJ SUSP
13.3300 mg | INTRAMUSCULAR | Status: AC | PRN
Start: 2023-07-09 — End: 2023-07-09
  Administered 2023-07-09: 13.33 mg via INTRA_ARTICULAR

## 2023-07-09 MED ORDER — LIDOCAINE HCL 1 % IJ SOLN
0.3000 mL | INTRAMUSCULAR | Status: AC | PRN
Start: 2023-07-09 — End: 2023-07-09
  Administered 2023-07-09: .3 mL

## 2023-07-09 MED ORDER — BUPIVACAINE HCL 0.5 % IJ SOLN
0.3300 mL | INTRAMUSCULAR | Status: AC | PRN
Start: 2023-07-09 — End: 2023-07-09
  Administered 2023-07-09: .33 mL via INTRA_ARTICULAR

## 2023-07-09 NOTE — Progress Notes (Signed)
 Office Visit Note   Patient: Cassidy Jones           Date of Birth: 04-Aug-1945           MRN: [id number] Visit Date: 07/09/2023              Requested by: Aldo Hun, MD [address] [place],  [place] [address] PCP: Aldo Hun, MD   Assessment & Plan: Visit Diagnoses:  1. Hallux rigidus, right foot     Plan: History of Present Illness Cassidy Jones is an 78 year old female who presents with pain in her right big toe.  She experiences pain localized to her right big toe, with no symptoms in her left foot. The pain is significant enough that she prioritizes treatment for her toe over her hand.  Assessment and Plan Right big toe pain Chronic pain in the right big toe requiring intervention. - Administered injection to the right big toe for pain relief.  Follow-Up Instructions: No follow-ups on file.   Orders:  Orders Placed This Encounter  Procedures   Small Joint Inj: R great MTP   No orders of the defined types were placed in this encounter.     Procedures: Small Joint Inj: R great MTP on 07/09/2023 2:12 PM Indications: pain Details: 25 G needle Medications: 0.3 mL lidocaine  1 %; 0.33 mL bupivacaine  0.5 %; 13.33 mg methylPREDNISolone  acetate 40 MG/ML Outcome: tolerated well, no immediate complications Patient was prepped and draped in the usual sterile fashion.       Clinical Data: No additional findings.   Subjective: Chief Complaint  Patient presents with   Right Foot - Pain     PMFS History: Patient Active Problem List   Diagnosis Date Noted   Atrial tachycardia - with scant palpitations 11/02/2021   Syncope 11/02/2021   Primary osteoarthritis of right knee 08/15/2021   Bradycardia 08/03/2021   Hallux rigidus of left foot 01/12/2020   Pain in left foot 01/06/2016   Hallux rigidus, right foot 01/06/2016   Complex endometrial hyperplasia without atypia 05/06/2015   Past Medical History:  Diagnosis Date   ADHD (attention deficit  hyperactivity disorder)    Anxiety    Arthritis    hands, feet, neck   Complex endometrial hyperplasia without atypia 05/06/2015   Depression    History of blood transfusion 2005   at [facility]   Hyperlipidemia    Hypertension    Hypothyroidism    Renal disorder    stage 3 kidney disease   Stroke [facility])    years ago, no deficits   Thyroid disease    thyroid gland removed   UC (ulcerative colitis) (HCC)    and colon polyps    No family history on file.  Past Surgical History:  Procedure Laterality Date   cataract surgery Bilateral    colon removed     x 4 surgeries  Hx UC and colon polyps   COLON SURGERY     COLONOSCOPY     EYE SURGERY     goiters removed     x 2 at age 73, age 10   HYSTEROSCOPY WITH D & C N/A 05/06/2015   Procedure: DILATATION AND CURETTAGE /HYSTEROSCOPY;  Surgeon: Terri Fester, MD;  Location: [facility];  Service: Gynecology;  Laterality: N/A;   WISDOM TOOTH EXTRACTION     Social History   Occupational History   Not on file  Tobacco Use   Smoking status: Never   Smokeless tobacco:  Never  Substance and Sexual Activity   Alcohol use: Yes    Alcohol/week: 10.0 standard drinks of alcohol    Types: 10 Glasses of wine per week    Comment: wine  6 oz wine every night   Drug use: No   Sexual activity: Yes    Birth control/protection: Post-menopausal

## 2023-08-07 ENCOUNTER — Other Ambulatory Visit (HOSPITAL_COMMUNITY): Payer: Self-pay

## 2023-08-07 MED ORDER — LISDEXAMFETAMINE DIMESYLATE 40 MG PO CAPS
40.0000 mg | ORAL_CAPSULE | Freq: Every morning | ORAL | 0 refills | Status: DC
  Filled 2023-08-07: qty 30, 30d supply, fill #0

## 2023-08-08 ENCOUNTER — Other Ambulatory Visit (HOSPITAL_COMMUNITY): Payer: Self-pay

## 2023-08-10 ENCOUNTER — Other Ambulatory Visit (HOSPITAL_COMMUNITY): Payer: Self-pay

## 2023-08-29 ENCOUNTER — Telehealth (HOSPITAL_COMMUNITY): Payer: Self-pay

## 2023-08-29 NOTE — Telephone Encounter (Signed)
 Auth Submission: NO AUTH NEEDED Site of care: Site of care: [facility] Payer: Medicare A/B, Aetna Supp Medication & CPT/J Code(s) submitted: Prolia  (Denosumab ) [id number] Diagnosis Code: M81.0 Route of submission (phone, fax, portal):  Phone # Fax # Auth type: Buy/Bill HB Units/visits requested: 60mg  x 2 doses Reference number:  Approval from: 08/29/23 to 08/28/24     Medicare A/B will cover 80%. Aetna Supplement will cover remaining 20%.

## 2023-09-03 ENCOUNTER — Encounter: Payer: Self-pay | Admitting: Neurology

## 2023-09-03 ENCOUNTER — Other Ambulatory Visit: Payer: Self-pay

## 2023-09-03 DIAGNOSIS — M1711 Unilateral primary osteoarthritis, right knee: Secondary | ICD-10-CM

## 2023-09-04 ENCOUNTER — Other Ambulatory Visit (HOSPITAL_COMMUNITY): Payer: Self-pay

## 2023-09-04 MED ORDER — LISDEXAMFETAMINE DIMESYLATE 40 MG PO CAPS
40.0000 mg | ORAL_CAPSULE | Freq: Every morning | ORAL | 0 refills | Status: DC
  Filled 2023-09-04: qty 30, 30d supply, fill #0

## 2023-09-17 ENCOUNTER — Encounter: Payer: Self-pay | Admitting: Orthopaedic Surgery

## 2023-09-17 ENCOUNTER — Ambulatory Visit: Admitting: Orthopaedic Surgery

## 2023-09-17 DIAGNOSIS — M1711 Unilateral primary osteoarthritis, right knee: Secondary | ICD-10-CM | POA: Diagnosis not present

## 2023-09-17 MED ORDER — HYALURONAN 88 MG/4ML IX SOSY
88.0000 mg | PREFILLED_SYRINGE | INTRA_ARTICULAR | Status: AC | PRN
Start: 2023-09-17 — End: 2023-09-17
  Administered 2023-09-17 (×2): 88 mg via INTRA_ARTICULAR

## 2023-09-17 NOTE — Progress Notes (Signed)
   Procedure Note  Patient: Cassidy Jones             Date of Birth: [birth year]           MRN: [id number]             Visit Date: 09/17/2023  Procedures: Visit Diagnoses:  1. Primary osteoarthritis of right knee     Large Joint Inj: R knee on 09/17/2023 2:15 PM Indications: pain Details: 22 G needle  Arthrogram: No  Medications: 88 mg Hyaluronan 88 MG/4ML Outcome: tolerated well, no immediate complications Patient was prepped and draped in the usual sterile fashion.

## 2023-10-03 ENCOUNTER — Other Ambulatory Visit (HOSPITAL_COMMUNITY): Payer: Self-pay

## 2023-10-03 MED ORDER — LISDEXAMFETAMINE DIMESYLATE 40 MG PO CAPS
40.0000 mg | ORAL_CAPSULE | Freq: Every morning | ORAL | 0 refills | Status: DC
  Filled 2023-10-04: qty 30, 30d supply, fill #0

## 2023-10-04 ENCOUNTER — Other Ambulatory Visit (HOSPITAL_COMMUNITY): Payer: Self-pay

## 2023-10-15 ENCOUNTER — Other Ambulatory Visit (INDEPENDENT_AMBULATORY_CARE_PROVIDER_SITE_OTHER)

## 2023-10-15 ENCOUNTER — Ambulatory Visit (INDEPENDENT_AMBULATORY_CARE_PROVIDER_SITE_OTHER): Admitting: Orthopaedic Surgery

## 2023-10-15 ENCOUNTER — Other Ambulatory Visit (INDEPENDENT_AMBULATORY_CARE_PROVIDER_SITE_OTHER): Payer: Self-pay

## 2023-10-15 DIAGNOSIS — M1711 Unilateral primary osteoarthritis, right knee: Secondary | ICD-10-CM

## 2023-10-15 DIAGNOSIS — M25552 Pain in left hip: Secondary | ICD-10-CM | POA: Diagnosis not present

## 2023-10-15 DIAGNOSIS — M1612 Unilateral primary osteoarthritis, left hip: Secondary | ICD-10-CM | POA: Diagnosis not present

## 2023-10-15 MED ORDER — METHYLPREDNISOLONE ACETATE 40 MG/ML IJ SUSP
40.0000 mg | INTRAMUSCULAR | Status: AC | PRN
  Administered 2023-10-15: 40 mg via INTRA_ARTICULAR

## 2023-10-15 MED ORDER — BUPIVACAINE HCL 0.5 % IJ SOLN
2.0000 mL | INTRAMUSCULAR | Status: AC | PRN
Start: 2023-10-15 — End: 2023-10-15
  Administered 2023-10-15: 2 mL via INTRA_ARTICULAR

## 2023-10-15 MED ORDER — LIDOCAINE HCL 1 % IJ SOLN
2.0000 mL | INTRAMUSCULAR | Status: AC | PRN
  Administered 2023-10-15: 2 mL

## 2023-10-15 NOTE — Progress Notes (Signed)
 Office Visit Note   Patient: Cassidy Jones           Date of Birth: [birth year]           MRN: [id number] Visit Date: 10/15/2023              Requested by: Shayne Anes, MD [address] [place],  [place] [address] PCP: Shayne Anes, MD   Assessment & Plan: Visit Diagnoses:  1. Primary osteoarthritis of left hip   2. Primary osteoarthritis of right knee     Plan: History of Present Illness Cassidy Jones is an 78 year old female with polyarticular osteoarthritis who presents with worsening joint pain.  She experiences worsening joint pain, particularly in the right knee and left hip, affecting her quality of life despite conservative care. Knee supports provide some relief. Cortisone injections have been effective for knee pain management.  Left hip pain is localized to the groin, started last week, lasted two days, and required Tylenol  with codeine . Oxycontin  is reserved for very severe pain. Hip pain worsens when walking upstairs but not on flat surfaces.  She uses Tylenol  with codeine  every third day if needed to maintain quality of life. She has a history of multiple surgeries and generally avoids pain medication, using it only when necessary.  Physical Exam MUSCULOSKELETAL: Left hip non-tender.  No pain on flexion and rotation.  Right knee unchanged from prior exams.  Results RADIOLOGY Hip X-ray: Moderate osteoarthritic changes Knee X-ray: Bone-on-bone changes  Assessment and Plan Right knee osteoarthritis with pain Chronic osteoarthritis with bone-on-bone changes. Effective pain management with injections and supports. Prefers Tylenol  with codeine  for severe pain. - Administer cortisone injection to the right knee. - Continue use of knee supports as needed. - Continue Tylenol  with codeine  for pain management as needed.  Left hip osteoarthritis with pain Moderate osteoarthritis with groin pain exacerbated by stairs. Manages pain with Tylenol  with codeine ,  reserves OxyContin  for severe pain. - Continue current pain management with Tylenol  with codeine  as needed. - Reserve OxyContin  for severe pain.  Follow-Up Instructions: No follow-ups on file.   Orders:  Orders Placed This Encounter  Procedures   Large Joint Inj: R knee   XR HIP UNILAT W OR W/O PELVIS 2-3 VIEWS LEFT   XR KNEE 3 VIEW RIGHT   No orders of the defined types were placed in this encounter.     Procedures: Large Joint Inj: R knee on 10/15/2023 1:52 PM Indications: pain Details: 22 G needle  Arthrogram: No  Medications: 40 mg methylPREDNISolone  acetate 40 MG/ML; 2 mL lidocaine  1 %; 2 mL bupivacaine  0.5 % Consent was given by the patient. Patient was prepped and draped in the usual sterile fashion.       Clinical Data: No additional findings.   Subjective: Chief Complaint  Patient presents with   Right Knee - Pain   Left Hip - Pain    HPI  Review of Systems  Constitutional: Negative.   HENT: Negative.    Eyes: Negative.   Respiratory: Negative.    Cardiovascular: Negative.   Endocrine: Negative.   Musculoskeletal: Negative.   Neurological: Negative.   Hematological: Negative.   Psychiatric/Behavioral: Negative.    All other systems reviewed and are negative.    Objective: Vital Signs: There were no vitals taken for this visit.  Physical Exam Vitals and nursing note reviewed.  Constitutional:      Appearance: She is well-developed.  HENT:     Head: Atraumatic.  Nose: Nose normal.  Eyes:     Extraocular Movements: Extraocular movements intact.  Cardiovascular:     Pulses: Normal pulses.  Pulmonary:     Effort: Pulmonary effort is normal.  Abdominal:     Palpations: Abdomen is soft.  Musculoskeletal:     Cervical back: Neck supple.  Skin:    General: Skin is warm.     Capillary Refill: Capillary refill takes less than 2 seconds.  Neurological:     Mental Status: She is alert. Mental status is at baseline.  Psychiatric:         Behavior: Behavior normal.        Thought Content: Thought content normal.        Judgment: Judgment normal.     Ortho Exam  Specialty Comments:  No specialty comments available.  Imaging: XR HIP UNILAT W OR W/O PELVIS 2-3 VIEWS LEFT Result Date: 10/15/2023 Xrays of the pelvis and left hip show moderate osteoarthritis with noticeable joint space narrowing..  XR KNEE 3 VIEW RIGHT Result Date: 10/15/2023 X-rays demonstrate severe tricompartmental osteoarthritis.  Bone-on-bone joint space narrowing.  Kellgren-Lawrence stage IV    PMFS History: Patient Active Problem List   Diagnosis Date Noted   Primary osteoarthritis of left hip 10/15/2023   Atrial tachycardia - with scant palpitations 11/02/2021   Syncope 11/02/2021   Primary osteoarthritis of right knee 08/15/2021   Bradycardia 08/03/2021   Hallux rigidus of left foot 01/12/2020   Pain in left foot 01/06/2016   Hallux rigidus, right foot 01/06/2016   Complex endometrial hyperplasia without atypia 05/06/2015   Past Medical History:  Diagnosis Date   ADHD (attention deficit hyperactivity disorder)    Anxiety    Arthritis    hands, feet, neck   Complex endometrial hyperplasia without atypia 05/06/2015   Depression    History of blood transfusion 2005   at [facility]   Hyperlipidemia    Hypertension    Hypothyroidism    Renal disorder    stage 3 kidney disease   Stroke [facility])    years ago, no deficits   Thyroid disease    thyroid gland removed   UC (ulcerative colitis) (HCC)    and colon polyps    No family history on file.  Past Surgical History:  Procedure Laterality Date   cataract surgery Bilateral    colon removed     x 4 surgeries  Hx UC and colon polyps   COLON SURGERY     COLONOSCOPY     EYE SURGERY     goiters removed     x 2 at age 20, age 5   HYSTEROSCOPY WITH D & C N/A 05/06/2015   Procedure: DILATATION AND CURETTAGE /HYSTEROSCOPY;  Surgeon: Robbi Render, MD;  Location: [facility];  Service:  Gynecology;  Laterality: N/A;   WISDOM TOOTH EXTRACTION     Social History   Occupational History   Not on file  Tobacco Use   Smoking status: Never   Smokeless tobacco: Never  Substance and Sexual Activity   Alcohol use: Yes    Alcohol/week: 10.0 standard drinks of alcohol    Types: 10 Glasses of wine per week    Comment: wine  6 oz wine every night   Drug use: No   Sexual activity: Yes    Birth control/protection: Post-menopausal

## 2023-10-21 ENCOUNTER — Other Ambulatory Visit (HOSPITAL_COMMUNITY): Payer: Self-pay | Admitting: Internal Medicine

## 2023-10-21 DIAGNOSIS — M810 Age-related osteoporosis without current pathological fracture: Secondary | ICD-10-CM | POA: Insufficient documentation

## 2023-11-04 ENCOUNTER — Ambulatory Visit: Admitting: Neurology

## 2023-11-05 ENCOUNTER — Other Ambulatory Visit (HOSPITAL_COMMUNITY): Payer: Self-pay

## 2023-11-05 MED ORDER — LISDEXAMFETAMINE DIMESYLATE 40 MG PO CAPS
40.0000 mg | ORAL_CAPSULE | Freq: Every day | ORAL | 0 refills | Status: DC
  Filled 2023-11-05: qty 30, 30d supply, fill #0

## 2023-11-06 ENCOUNTER — Other Ambulatory Visit (HOSPITAL_COMMUNITY): Payer: Self-pay

## 2023-11-18 ENCOUNTER — Ambulatory Visit (INDEPENDENT_AMBULATORY_CARE_PROVIDER_SITE_OTHER): Admitting: Neurology

## 2023-11-18 ENCOUNTER — Encounter: Payer: Self-pay | Admitting: Neurology

## 2023-11-18 VITALS — BP 134/66 | HR 85 | Ht 61.0 in | Wt 125.0 lb

## 2023-11-18 DIAGNOSIS — R202 Paresthesia of skin: Secondary | ICD-10-CM | POA: Diagnosis not present

## 2023-11-18 DIAGNOSIS — R200 Anesthesia of skin: Secondary | ICD-10-CM | POA: Diagnosis not present

## 2023-11-18 NOTE — Patient Instructions (Addendum)
 It was very nice meeting you today.  If your feet symptoms get worse and you would like to proceed with nerve testing, please call my office to schedule.

## 2023-11-18 NOTE — Progress Notes (Signed)
 [facility] Clinic Note - Initial Visit   Date: 11/18/2023   Cassidy Jones MRN: [id number] DOB: [birth year]   Dear Dr. Shayne:  Thank you for your kind referral of Cassidy Jones for consultation of feet numbness. Although her history is well known to you, please allow us  to reiterate it for the purpose of our medical record. The patient was accompanied to the clinic by self.   Cassidy Jones is a 78 y.o. right-handed female with ulcerative colitis s/p colectomy, hypothyroidism, and hypertension presenting for evaluation of bilateral feet numbness.   IMPRESSION/PLAN: Bilateral feet numbness/tightness.  Exam shows mildly reduced distal sensation, mild weakness with toe flexion, with intact reflexes at the ankle and brisk reflexes at the knees.  Symptoms may be stemming from neuropathy vs lumbar canal stenosis (brisk reflexes), however she denies exertional back pain or leg weakness, making the latter less likely.  NCS/EMG would be diagnostic.  She would like to consider testing only if symptoms get worse and will contact me, if needed.     ------------------------------------------------------------- History of present illness: Starting in early 2024, she began having sensation of tightness/numbness in the toes, which has extended into the soles of the feet, as well as the dorsum of the feet.  She does not have any tingling or burning pain.  No personal history of diabetes.    She lives alone in two level home.  Her husband passed away 3.5 years ago.  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    Past Medical History:  Diagnosis Date   ADHD (attention deficit hyperactivity disorder)    Anxiety    Arthritis    hands, feet, neck   Complex endometrial hyperplasia without atypia 05/06/2015   Depression    History of blood transfusion 2005   at [facility]   Hyperlipidemia    Hypertension    Hypothyroidism    Renal  disorder    stage 3 kidney disease   Stroke [facility])    years ago, no deficits   Thyroid disease    thyroid gland removed   UC (ulcerative colitis) (HCC)    and colon polyps    Past Surgical History:  Procedure Laterality Date   cataract surgery Bilateral    colon removed     x 4 surgeries  Hx UC and colon polyps   COLON SURGERY     COLONOSCOPY     EYE SURGERY     goiters removed     x 2 at age 58, age 72   HYSTEROSCOPY WITH D & C N/A 05/06/2015   Procedure: DILATATION AND CURETTAGE /HYSTEROSCOPY;  Surgeon: Robbi Render, MD;  Location: [facility];  Service: Gynecology;  Laterality: N/A;   WISDOM TOOTH EXTRACTION       Medications:  Outpatient Encounter Medications as of 11/18/2023  Medication Sig Note   acetaminophen  (TYLENOL ) 500 MG tablet Take 500 mg by mouth as needed.    acetaminophen -codeine  (TYLENOL  #3) 300-30 MG tablet Take 1 tablet by mouth 2 (two) times daily as needed for moderate pain.    aspirin 81 MG chewable tablet Chew 81 mg by mouth daily.    B Complex Vitamins (VITAMIN B-COMPLEX) TABS Take 1 tablet by mouth daily.    calcitonin, salmon, (MIACALCIN/FORTICAL) 200 UNIT/ACT nasal spray Place 1 spray into alternate nostrils daily.  11/12/2013: Alternating nostrils   calcitRIOL (ROCALTROL) 0.25 MCG capsule Take 0.25-0.5 mcg by mouth See admin instructions. Patient alternates between 0.25mcg and 0.5mcg daily  11/12/2013: Received from: External Pharmacy   calcium carbonate 200 MG capsule Take 600 mg by mouth daily.     ciprofloxacin (CIPRO) 500 MG tablet Take 500 mg by mouth 3 (three) times a week.    CODEINE  SULFATE PO Take by mouth. With peppermint oil 7.5mg /ml 4 times per day    diclofenac  sodium (VOLTAREN ) 1 % GEL Apply 2 g topically 4 (four) times daily.    estradiol (CLIMARA - DOSED IN MG/24 HR) 0.025 mg/24hr patch Place 0.025 mg onto the skin once a week. 11/12/2013: Changes patch each Monday   ezetimibe (ZETIA) 10 MG tablet Take 10 mg by mouth daily.    felodipine  (PLENDIL) 5 MG 24 hr tablet Take 2.5 tablets by mouth at bedtime.  11/12/2013: Received from: External Pharmacy Received Sig:    Ferrous Sulfate (IRON PO) Take 18 mg by mouth daily.    Glucosamine-Chondroitin 750-600 MG CHEW Chew by mouth.    lisdexamfetamine (VYVANSE ) 40 MG capsule Take 1 capsule (40 mg total) by mouth daily.    LORazepam (ATIVAN) 1 MG tablet Take 0.25-1 tablets by mouth 2 (two) times daily. 11/12/2013: Received from: External Pharmacy   LORazepam (ATIVAN) 2 MG tablet Take 2.5-3 mg by mouth every 6 (six) hours as needed for anxiety.    MELATONIN PO Take 3 mg by mouth daily.    metroNIDAZOLE (METROGEL) 0.75 % vaginal gel Place 1 Applicatorful vaginally See admin instructions. 5 times per week 11/12/2013: Continuous med   Multiple Vitamins-Minerals (ZINC PO) Take 5 mg by mouth daily.    nebivolol  (BYSTOLIC ) 2.5 MG tablet Take 1 tablet (2.5 mg total) by mouth daily.    Omega-3 Fatty Acids (FISH OIL OMEGA-3 PO) Take 300 mg by mouth daily.    oxyCODONE -acetaminophen  (PERCOCET) 5-325 MG tablet Take 1 tablet by mouth daily as needed for severe pain.    potassium chloride SA (K-DUR,KLOR-CON) 20 MEQ tablet Take 1 tablet by mouth every evening.  11/12/2013: Received from: External Pharmacy Received Sig:    pravastatin (PRAVACHOL) 20 MG tablet Take 1 tablet by mouth every evening.  11/12/2013: Received from: External Pharmacy Received Sig:    PRESCRIPTION MEDICATION Place 1 suppository rectally See admin instructions. Metronidazole 125mg  Suppository: 1 suppository per rectum 5 times per week.    progesterone (PROMETRIUM) 100 MG capsule Take 100 mg by mouth at bedtime.    SYNTHROID 75 MCG tablet Take 1 tablet by mouth daily. 1 tablet Monday - Saturday, 1.5 tab on sunday 11/12/2013: Received from: External Pharmacy Received Sig:    terconazole (TERAZOL 7) 0.4 % vaginal cream Place 1 applicator vaginally once a week.  11/12/2013: Received from: External Pharmacy Received Sig:    Turmeric Curcumin 500  MG CAPS Take 1 capsule by mouth daily.    vitamin B-12 (CYANOCOBALAMIN) 1000 MCG tablet Take 1,000 mcg by mouth daily.    VYVANSE  40 MG capsule Take 40 mg by mouth daily. 11/12/2013: Patient opens capsule and sprinkles out approx 25mg     lisdexamfetamine (VYVANSE ) 40 MG capsule Take 1 capsule (40 mg total) by mouth daily. (Patient not taking: Reported on 11/18/2023)    No facility-administered encounter medications on file as of 11/18/2023.    Allergies:  Allergies  Allergen Reactions   Mercaptopurine     Other reaction(s): Other (See Comments) Kidney failure    Metronidazole     Cannot take the tablets due to numbness in her toes. Can take active ingredient     Family History: Family History  Problem Relation  Age of Onset   Asthma Mother    Arthritis Mother    Arthritis Father    Heart failure Father     Social History: Social History   Tobacco Use   Smoking status: Never   Smokeless tobacco: Never  Substance Use Topics   Alcohol use: Yes    Alcohol/week: 10.0 standard drinks of alcohol    Types: 10 Glasses of wine per week    Comment: Occasional Martini   Drug use: No   Social History   Social History Narrative   Are you right handed or left handed? Right Handed   Are you currently employed ? No    What is your current occupation?    Do you live at home alone? Yes   Who lives with you? Husband passed was married for 56 years.    What type of home do you live in: 1 story or 2 story? Lives in a two story home         Vital Signs:  BP (!) 152/78   Pulse 85   Ht 5' 1 (1.549 m)   Wt 125 lb (56.7 kg)   SpO2 96%   BMI 23.62 kg/m   Neurological Exam: MENTAL STATUS including orientation to time, place, person, recent and remote memory, attention span and concentration, language, and fund of knowledge is normal.  Speech is not dysarthric.  CRANIAL NERVES: II:  No visual field defects.     III-IV-VI: Pupils equal round and reactive to light.  Normal conjugate,  extra-ocular eye movements in all directions of gaze.  No nystagmus.  No ptosis.   V:  Normal facial sensation.    VII:  Normal facial symmetry and movements.   VIII:  Normal hearing and vestibular function.   IX-X:  Normal palatal movement.   XI:  Normal shoulder shrug and head rotation.   XII:  Normal tongue strength and range of motion, no deviation or fasciculation.  MOTOR:  No atrophy, fasciculations or abnormal movements.  No pronator drift.   Upper Extremity:  Right  Left  Deltoid  5/5   5/5   Biceps  5/5   5/5   Triceps  5/5   5/5   Infraspinatus 5/5  5/5  Medial pectoralis 5/5  5/5  Wrist extensors  5/5   5/5   Wrist flexors  5/5   5/5   Finger extensors  5/5   5/5   Finger flexors  5/5   5/5   Dorsal interossei  5/5   5/5   Abductor pollicis  5/5   5/5   Tone (Ashworth scale)  0  0   Lower Extremity:  Right  Left  Hip flexors  5/5   5/5   Hip extensors  5/5   5/5   Adductor 5/5  5/5  Abductor 5/5  5/5  Knee flexors  5/5   5/5   Knee extensors  5/5   5/5   Dorsiflexors  5/5   5/5   Plantarflexors  5/5   5/5   Toe extensors  5/5   5/5   Toe flexors  5/5   5/5   Tone (Ashworth scale)  0  0   MSRs:                                           Right  Left brachioradialis 2+  2+  biceps 2+  2+  triceps 2+  2+  patellar 3+  3+  ankle jerk 2+  2+  Hoffman no  no  plantar response down  down   SENSORY:  Mildly reduced vibration at the toes bilaterally.  Intact sensation above the ankles.  Romberg's sign absent.   COORDINATION/GAIT: Normal finger-to- nose-finger.  Intact rapid alternating movements bilaterally. Gait narrow based and stable. Tandem and stressed gait intact.     Thank you for allowing me to participate in patient's care.  If I can answer any additional questions, I would be pleased to do so.    Sincerely,    Pius Byrom K. Tobie, DO

## 2023-12-05 ENCOUNTER — Other Ambulatory Visit (HOSPITAL_COMMUNITY): Payer: Self-pay

## 2023-12-05 MED ORDER — LISDEXAMFETAMINE DIMESYLATE 40 MG PO CAPS
40.0000 mg | ORAL_CAPSULE | Freq: Every day | ORAL | 0 refills | Status: DC
  Filled 2023-12-05: qty 30, 30d supply, fill #0

## 2023-12-09 ENCOUNTER — Encounter: Payer: Self-pay | Admitting: Radiology

## 2023-12-17 ENCOUNTER — Ambulatory Visit: Admitting: Orthopaedic Surgery

## 2023-12-24 ENCOUNTER — Ambulatory Visit: Admitting: Orthopaedic Surgery

## 2023-12-24 DIAGNOSIS — M2021 Hallux rigidus, right foot: Secondary | ICD-10-CM | POA: Diagnosis not present

## 2023-12-24 MED ORDER — BUPIVACAINE HCL 0.5 % IJ SOLN
0.3300 mL | INTRAMUSCULAR | Status: AC | PRN
  Administered 2023-12-24: .33 mL via INTRA_ARTICULAR

## 2023-12-24 MED ORDER — METHYLPREDNISOLONE ACETATE 40 MG/ML IJ SUSP
13.3300 mg | INTRAMUSCULAR | Status: AC | PRN
  Administered 2023-12-24: 13.33 mg via INTRA_ARTICULAR

## 2023-12-24 MED ORDER — LIDOCAINE HCL 1 % IJ SOLN
0.3000 mL | INTRAMUSCULAR | Status: AC | PRN
  Administered 2023-12-24: .3 mL

## 2023-12-24 NOTE — Progress Notes (Signed)
   Office Visit Note   Patient: Cassidy Jones           Date of Birth: 02/02/1946           MRN: [id number] Visit Date: 12/24/2023              Requested by: Shayne Anes, MD [address] [place],  [place] [address] PCP: Shayne Anes, MD   Assessment & Plan: Visit Diagnoses:  1. Hallux rigidus, right foot     Plan: History of Present Illness Cassidy Jones is an 78 year old female who presents for a right big toe injection.  She experiences significant joint pain in her toes, with the right big toe being particularly bothersome. The pain has worsened over time, and she describes her joints as deteriorating rapidly. Recently, she has noticed soreness and swelling in her feet.  Her history of kidney issues limits her use of anti-inflammatory medications. She manages her pain with Tylenol , taking 500 mg every third day, and Tylenol  with codeine  every sixth day. She is cautious about medication use to maintain her quality of life while living alone.  Assessment and Plan Right hallux rigidus Chronic condition with recent exacerbation. Previous injections effective. Anti-inflammatories contraindicated due to renal issues. Rheumatology referral considered. - Discuss potential rheumatology referral with primary care provider. - repeat cortisone injection today  Follow-Up Instructions: No follow-ups on file.   Orders:  No orders of the defined types were placed in this encounter.  No orders of the defined types were placed in this encounter.     Procedures: Small Joint Inj: R great MTP on 12/24/2023 2:57 PM Indications: pain Details: 25 G needle Medications: 0.3 mL lidocaine  1 %; 0.33 mL bupivacaine  0.5 %; 13.33 mg methylPREDNISolone  acetate 40 MG/ML Outcome: tolerated well, no immediate complications Patient was prepped and draped in the usual sterile fashion.       Clinical Data: No additional findings.   Subjective: Chief Complaint  Patient presents with    Right Foot - Pain

## 2023-12-30 ENCOUNTER — Ambulatory Visit (HOSPITAL_COMMUNITY)
Admission: RE | Admit: 2023-12-30 | Discharge: 2023-12-30 | Disposition: A | Discharge status: Home / Self Care | Source: Ambulatory Visit | Attending: Internal Medicine | Admitting: Internal Medicine

## 2023-12-30 VITALS — BP 126/46 | HR 77 | Temp 97.4°F

## 2023-12-30 DIAGNOSIS — M810 Age-related osteoporosis without current pathological fracture: Secondary | ICD-10-CM | POA: Diagnosis present

## 2023-12-30 MED ORDER — DENOSUMAB 60 MG/ML ~~LOC~~ SOSY
PREFILLED_SYRINGE | SUBCUTANEOUS | Status: AC
  Filled 2023-12-30: qty 1

## 2023-12-30 MED ORDER — DENOSUMAB 60 MG/ML ~~LOC~~ SOSY
60.0000 mg | PREFILLED_SYRINGE | Freq: Once | SUBCUTANEOUS | Status: AC
  Administered 2023-12-30: 60 mg via SUBCUTANEOUS

## 2024-01-03 ENCOUNTER — Other Ambulatory Visit (HOSPITAL_COMMUNITY): Payer: Self-pay

## 2024-01-03 MED ORDER — LISDEXAMFETAMINE DIMESYLATE 40 MG PO CAPS
40.0000 mg | ORAL_CAPSULE | Freq: Every morning | ORAL | 0 refills | Status: DC
  Filled 2024-01-03: qty 30, 30d supply, fill #0

## 2024-01-27 ENCOUNTER — Ambulatory Visit: Admitting: Physician Assistant

## 2024-01-28 ENCOUNTER — Other Ambulatory Visit: Payer: Self-pay | Admitting: Physician Assistant

## 2024-02-04 ENCOUNTER — Other Ambulatory Visit (HOSPITAL_COMMUNITY): Payer: Self-pay

## 2024-02-04 ENCOUNTER — Other Ambulatory Visit: Payer: Self-pay

## 2024-02-04 MED ORDER — LISDEXAMFETAMINE DIMESYLATE 40 MG PO CAPS
40.0000 mg | ORAL_CAPSULE | Freq: Every morning | ORAL | 0 refills | Status: DC
  Filled 2024-02-04: qty 30, 30d supply, fill #0

## 2024-02-21 ENCOUNTER — Ambulatory Visit: Attending: Physician Assistant | Admitting: Physician Assistant

## 2024-02-21 VITALS — BP 152/74 | HR 79 | Ht 61.0 in | Wt 124.0 lb

## 2024-02-21 DIAGNOSIS — I10 Essential (primary) hypertension: Secondary | ICD-10-CM | POA: Diagnosis not present

## 2024-02-21 DIAGNOSIS — I4719 Other supraventricular tachycardia: Secondary | ICD-10-CM | POA: Diagnosis not present

## 2024-02-21 NOTE — Patient Instructions (Signed)
 Medication Instructions:   Your physician recommends that you continue on your current medications as directed. Please refer to the Current Medication list given to you today.  *If you need a refill on your cardiac medications before your next appointment, please call your pharmacy*  Lab Work: NONE ORDERED  TODAY    If you have labs (blood work) drawn today and your tests are completely normal, you will receive your results only by: MyChart Message (if you have MyChart) OR A paper copy in the mail If you have any lab test that is abnormal or we need to change your treatment, we will call you to review the results.  Testing/Procedures: NONE ORDERED  TODAY   Follow-Up: At [facility], you and your health needs are our priority.  As part of our continuing mission to provide you with exceptional heart care, our providers are all part of one team.  This team includes your primary Cardiologist (physician) and Advanced Practice Providers or APPs (Physician Assistants and Nurse Practitioners) who all work together to provide you with the care you need, when you need it.  Your next appointment: CONTACT [facility] [phone] AS NEEDED FOR  ANY CARDIAC RELATED SYMPTOMS   We recommend signing up for the patient portal called MyChart.  Sign up information is provided on this After Visit Summary.  MyChart is used to connect with patients for Virtual Visits (Telemedicine).  Patients are able to view lab/test results, encounter notes, upcoming appointments, etc.  Non-urgent messages can be sent to your provider as well.   To learn more about what you can do with MyChart, go to [email or web address].   Other Instructions

## 2024-02-21 NOTE — Progress Notes (Signed)
 " Cardiology Office Note:  .   Date:  02/21/2024  ID:  Cassidy Jones, DOB 09/24/1945, MRN [id number] PCP: Shayne Anes, MD  [facility] Providers Cardiologist:  Victory LELON Claudene DOUGLAS, MD (Inactive) {  History of Present Illness: .   Cassidy Jones is a 79 y.o. 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 Fernande 01/25/22, anxious/anxiety intermittently limiting her life, better of late though still problematic, BB had helped fast rates and symptoms > she felt bst with nebivolol . Hx of syncope likely orthostatic/neurally medicated Discussed sinus slowing ? Sleep apnea No med changes made  I saw her 01/22/23 All in all she is doing well Plenty of arthritic/musculoskeletal aches/pains No CP, palpitations or cardiac awareness She has noted when 1st in bed or laying down for a nap, she is breathing a little fast No observed or perceived SOB otherwise, no DOE No near syncope or syncope. Chronic GI issues are at her baseline, though after years finally on a successful maintenance regime of antibiotic/antifungal regime  Today's visit is scheduled as an annual visit ROS:   She is doing really well from a cardiac perspective. No palpitations, tachycardia, cardiac awareness GI issues remain at her baseline/tolerable C/w her nephrologist and PMD regularly  Still mourning her husband and last year her best friend's death.   Arrhythmia/AAD hx Atach via monitor May 2023 No AAD to date  Studies Reviewed: SABRA    EKG done today and reviewed by myself:  SR 79bpm, no significant changes seen  06/30/2021: TTE  1. Left ventricular ejection fraction, by estimation, is 60 to 65%. The  left ventricle has normal function. The left ventricle has no regional  wall motion abnormalities. Left ventricular diastolic parameters are  consistent with Grade I diastolic   dysfunction (impaired relaxation).   2. Right ventricular systolic function is normal. The right ventricular  size is normal. There is normal pulmonary artery systolic pressure. The  estimated right ventricular systolic pressure is 23.4 mmHg.   3. The mitral valve is grossly normal. No evidence of mitral valve  regurgitation. No evidence of mitral stenosis.   4. The aortic valve is grossly normal. Aortic valve regurgitation is not  visualized. No aortic stenosis is present.   5. The inferior vena cava is normal in size with greater than 50%  respiratory variability, suggesting right atrial pressure of 3 mmHg.   May 2023 monitor The basic rhythm is normal sinus rhythm with an average heart rate of 78 bpm. Heart rate range 49 to 214 bpm. 7 nonsustained SVT episodes with fastest rate 214 bpm. Longest episode lasting 14 beats at 105 bpm. 2 episodes of significant bradycardia with 1 pause lasting 4.2 seconds on 06/15/2021 at 8:26 PM. Need to correlate with patient's diary. Infrequent PACs, PVCs, and nonsustained arrhythmia, all less than 1%. No reported symptoms during the study.   Risk Assessment/Calculations:    Physical Exam:   VS:  There were no vitals taken for this visit.   Wt Readings from Last 3 Encounters:  11/18/23 125 lb (56.7 kg)  01/22/23 122 lb 9.6 oz (55.6 kg)  01/25/22 123 lb 12.8 oz (56.2 kg)    GEN: Well nourished, well developed in no acute distress NECK: No JVD; No carotid bruits CARDIAC: RRR, no murmurs, rubs, gallops RESPIRATORY: CTA b/l without rales, wheezing or rhonchi  ABDOMEN: Soft, non-tender, non-distended EXTREMITIES: No edema;  No deformity   ASSESSMENT AND PLAN: .    ATach remains well controlled on low dose BB  HTN Got lost finding her way up to the office Home BPs reported much better 120/70's  Syncope Felt to have been orthostatic/neurally mediated Not recurrent    Dispo: continue annually visits, sooner if needed  Signed, Charlies Macario Arthur, PA-C   "

## 2024-03-05 ENCOUNTER — Other Ambulatory Visit (HOSPITAL_COMMUNITY): Payer: Self-pay

## 2024-03-05 MED ORDER — LISDEXAMFETAMINE DIMESYLATE 40 MG PO CAPS
40.0000 mg | ORAL_CAPSULE | Freq: Every morning | ORAL | 0 refills | Status: AC
  Filled 2024-03-05: qty 30, 30d supply, fill #0

## 2024-06-30 ENCOUNTER — Encounter (HOSPITAL_COMMUNITY)
